# Patient Record
Sex: Male | Born: 1960 | Race: White | Hispanic: No | Marital: Married | Smoking: Former smoker
Health system: Southern US, Community
[De-identification: ages and names within clinical notes are randomized; demographics above are authoritative.]

## PROBLEM LIST (undated history)

## (undated) DIAGNOSIS — E785 Hyperlipidemia, unspecified: Secondary | ICD-10-CM

## (undated) DIAGNOSIS — I209 Angina pectoris, unspecified: Secondary | ICD-10-CM

## (undated) DIAGNOSIS — I219 Acute myocardial infarction, unspecified: Secondary | ICD-10-CM

## (undated) DIAGNOSIS — F419 Anxiety disorder, unspecified: Secondary | ICD-10-CM

## (undated) DIAGNOSIS — I10 Essential (primary) hypertension: Secondary | ICD-10-CM

## (undated) DIAGNOSIS — G629 Polyneuropathy, unspecified: Secondary | ICD-10-CM

## (undated) HISTORY — PX: COLONOSCOPY: SHX174

## (undated) HISTORY — PX: FOOT SURGERY: SHX648

## (undated) HISTORY — DX: Hyperlipidemia, unspecified: E78.5

---

## 2013-12-08 ENCOUNTER — Ambulatory Visit (INDEPENDENT_AMBULATORY_CARE_PROVIDER_SITE_OTHER): Payer: 59

## 2013-12-08 ENCOUNTER — Ambulatory Visit (INDEPENDENT_AMBULATORY_CARE_PROVIDER_SITE_OTHER): Payer: 59 | Admitting: Family Medicine

## 2013-12-08 ENCOUNTER — Emergency Department: Payer: Self-pay | Admitting: Emergency Medicine

## 2013-12-08 VITALS — BP 130/80 | HR 87 | Temp 97.8°F | Resp 16 | Ht 63.75 in | Wt 152.0 lb

## 2013-12-08 DIAGNOSIS — R079 Chest pain, unspecified: Secondary | ICD-10-CM

## 2013-12-08 DIAGNOSIS — Z131 Encounter for screening for diabetes mellitus: Secondary | ICD-10-CM

## 2013-12-08 DIAGNOSIS — Z1329 Encounter for screening for other suspected endocrine disorder: Secondary | ICD-10-CM

## 2013-12-08 DIAGNOSIS — Z13220 Encounter for screening for lipoid disorders: Secondary | ICD-10-CM

## 2013-12-08 DIAGNOSIS — R0602 Shortness of breath: Secondary | ICD-10-CM

## 2013-12-08 LAB — COMPREHENSIVE METABOLIC PANEL
ALBUMIN: 3.9 g/dL (ref 3.4–5.0)
ANION GAP: 7 (ref 7–16)
Alkaline Phosphatase: 107 U/L
BILIRUBIN TOTAL: 0.4 mg/dL (ref 0.2–1.0)
BUN: 12 mg/dL (ref 7–18)
CREATININE: 0.99 mg/dL (ref 0.60–1.30)
Calcium, Total: 8.6 mg/dL (ref 8.5–10.1)
Chloride: 106 mmol/L (ref 98–107)
Co2: 29 mmol/L (ref 21–32)
Glucose: 118 mg/dL — ABNORMAL HIGH (ref 65–99)
OSMOLALITY: 284 (ref 275–301)
POTASSIUM: 3.7 mmol/L (ref 3.5–5.1)
SGOT(AST): 21 U/L (ref 15–37)
SGPT (ALT): 29 U/L
Sodium: 142 mmol/L (ref 136–145)
Total Protein: 7.5 g/dL (ref 6.4–8.2)

## 2013-12-08 LAB — CBC
HCT: 52.1 % — ABNORMAL HIGH (ref 40.0–52.0)
HGB: 17.5 g/dL (ref 13.0–18.0)
MCH: 30.4 pg (ref 26.0–34.0)
MCHC: 33.6 g/dL (ref 32.0–36.0)
MCV: 90 fL (ref 80–100)
PLATELETS: 193 10*3/uL (ref 150–440)
RBC: 5.77 10*6/uL (ref 4.40–5.90)
RDW: 14.1 % (ref 11.5–14.5)
WBC: 9.2 10*3/uL (ref 3.8–10.6)

## 2013-12-08 LAB — POCT CBC
Granulocyte percent: 70.9 %G (ref 37–80)
HCT, POC: 52 % (ref 43.5–53.7)
Hemoglobin: 17.3 g/dL (ref 14.1–18.1)
Lymph, poc: 2.6 (ref 0.6–3.4)
MCH, POC: 29.8 pg (ref 27–31.2)
MCHC: 33.3 g/dL (ref 31.8–35.4)
MCV: 89.3 fL (ref 80–97)
MID (cbc): 0.3 (ref 0–0.9)
MPV: 6.7 fL (ref 0–99.8)
POC Granulocyte: 7 — AB (ref 2–6.9)
POC LYMPH PERCENT: 26.1 %L (ref 10–50)
POC MID %: 3 %M (ref 0–12)
Platelet Count, POC: 212 10*3/uL (ref 142–424)
RBC: 5.82 M/uL (ref 4.69–6.13)
RDW, POC: 13.9 %
WBC: 9.9 10*3/uL (ref 4.6–10.2)

## 2013-12-08 LAB — COMPLETE METABOLIC PANEL WITH GFR
ALT: 19 U/L (ref 0–53)
AST: 16 U/L (ref 0–37)
Albumin: 4.6 g/dL (ref 3.5–5.2)
BUN: 10 mg/dL (ref 6–23)
CO2: 25 mEq/L (ref 19–32)
Calcium: 9.2 mg/dL (ref 8.4–10.5)
Chloride: 104 mEq/L (ref 96–112)
Creat: 0.76 mg/dL (ref 0.50–1.35)
GFR, Est African American: >89 mL/min
GFR, Est Non African American: >89 mL/min
Glucose, Bld: 90 mg/dL (ref 70–99)
Potassium: 4.3 mEq/L (ref 3.5–5.3)
Sodium: 139 mEq/L (ref 135–145)
Total Bilirubin: 0.5 mg/dL (ref 0.2–1.2)
Total Protein: 6.6 g/dL (ref 6.0–8.3)

## 2013-12-08 LAB — COMPLETE METABOLIC PANEL WITHOUT GFR: Alkaline Phosphatase: 87 U/L (ref 39–117)

## 2013-12-08 LAB — LIPID PANEL
Cholesterol: 225 mg/dL — ABNORMAL HIGH (ref 0–200)
HDL: 41 mg/dL (ref >39)
LDL Cholesterol: 170 mg/dL — ABNORMAL HIGH (ref 0–99)
Total CHOL/HDL Ratio: 5.5 ratio
Triglycerides: 70 mg/dL (ref <150)
VLDL: 14 mg/dL (ref 0–40)

## 2013-12-08 LAB — POCT GLYCOSYLATED HEMOGLOBIN (HGB A1C): Hemoglobin A1C: 5.7

## 2013-12-08 LAB — CK TOTAL AND CKMB (NOT AT ARMC)
CK, Total: 48 U/L
CK-MB: 1.6 ng/mL (ref 0.5–3.6)

## 2013-12-08 LAB — TROPONIN I: Troponin-I: <0.02 ng/mL

## 2013-12-08 NOTE — Progress Notes (Signed)
Chief Complaint:  Chief Complaint  Patient presents with  . Anxiety    x1 week     HPI: Vincent GalleryScott [name] is a 53 y.o. male who is here for diffuse midepigastric chest pain,  back pain  and also left sided chest pain for a "while now just with exertion"  He states it has been for at least 1 year but maybe as long as 2,  he can walk from his shop on his property and to his house and would get SOB and have CP. He works currently at [employer] in [employer] , and he used to work in Engineer, agriculturalchemical. He has wheezing back then but not so much since he has been [place]ferred to [employer] Chest pain  Is intermittent, last one was this Am, he usually has it with exertion and not necessarily at rest. Can last for as long as he is exerting himself. He has had NKI,  He was smoking cigs abotu 1 ppd x 4o years up until abotu 2 years ago when he had to push a nonworking care and felt chest pain, he then started picking up cigars . Denies swelling, he woke up this morning felt like he was going to die , primarily he was SOB but not having CP. He denies OSA, he denies COPD He denies any hyperlipidemia, diabetes, no family history of heart disease.  He is a smoker , he smokes cigars 4 times a day and cigs 1 ppd for 40 years Never goes to doctor so has not seen cardiologist, he feels it goes from hi schest to his throat.  He denies getting clammy , having palpitations, diaphoretic, n/v/abd pain He has been doing everthing without exerting himself.  No calf pain, denies PAD, he does have some numbness and tingling i his feet periodically   He has ringing in his ears. 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  He has had decrease hearing.   History reviewed. No pertinent past medical history. Past Surgical History  Procedure Laterality Date  . Foot  surgery     History   Social History  . Marital Status: Married    Spouse Name: N/A    Number of Children: N/A  . Years of Education: N/A   Social History Main Topics  . Smoking status: Current Every Day Smoker  . Smokeless tobacco: None  . Alcohol Use: No  . Drug Use: Yes    Special: Marijuana  . Sexual Activity: None   Other Topics Concern  . None   Social History Narrative  . None   Family History  Problem Relation Age of Onset  . Diabetes Father   . Diabetes Sister   . Cancer Paternal Grandmother   . Mental illness Sister    No Known Allergies Prior to Admission medications   Not on File     ROS: The patient denies fevers, chills, night sweats, unintentional weight loss, palpitations, , nausea, vomiting, abdominal pain, dysuria, hematuria, melena  All other systems have been reviewed and were otherwise negative with the exception of those mentioned in the HPI and as above.    PHYSICAL EXAM: Filed Vitals:   12/08/13 1352  BP: 130/80  Pulse: 87  Temp: 97.8 F (36.6 C)  Resp: 16   Filed Vitals:   12/08/13 1352  Height: 5' 3.75" (1.619 m)  Weight: 152 lb (68.947 kg)   Body mass index is 26.3 kg/(m^2).  General: Alert, no acute distress HEENT:  Normocephalic, atraumatic, oropharynx patent. EOMI, PERRLA, fundo exam nl, tm nl Cardiovascular:  Regular rate and rhythm, no rubs murmurs or gallops.  No Carotid bruits, radial pulse intact. No pedal edema.  Respiratory: Clear to auscultation bilaterally.  No wheezes, rales, or rhonchi.  No cyanosis, no use of accessory musculature GI: No organomegaly, abdomen is soft and non-tender, positive bowel sounds.  No masses. Skin: No rashes. Neurologic: Facial musculature symmetric. Psychiatric: Patient is appropriate throughout our interaction. CN 2-12 grossly intact Lymphatic: No cervical lymphadenopathy Musculoskeletal: Gait intact. 5/5 strength IN UE and Kai Calico   LABS: Results for orders placed or performed in  visit on 12/08/13  POCT CBC  Result Value Ref Range   WBC 9.9 4.6 - 10.2 K/uL   Lymph, poc 2.6 0.6 - 3.4   POC LYMPH PERCENT 26.1 10 - 50 %L   MID (cbc) 0.3 0 - 0.9   POC MID % 3.0 0 - 12 %M   POC Granulocyte 7.0 (A) 2 - 6.9   Granulocyte percent 70.9 37 - 80 %G   RBC 5.82 4.69 - 6.13 M/uL   Hemoglobin 17.3 14.1 - 18.1 g/dL   HCT, POC 40.9 81.1 - 53.7 %   MCV 89.3 80 - 97 fL   MCH, POC 29.8 27 - 31.2 pg   MCHC 33.3 31.8 - 35.4 g/dL   RDW, POC 91.4 %   Platelet Count, POC 212 142 - 424 K/uL   MPV 6.7 0 - 99.8 fL  POCT glycosylated hemoglobin (Hb A1C)  Result Value Ref Range   Hemoglobin A1C 5.7      EKG/XRAY:   Primary read interpreted by Dr. Conley Rolls at [facility]. Please comment if there is any worrisome left perihilar density , otherwise no acute cardiopulmonary process EKG shows abnl changes without ST elevation/depression   ASSESSMENT/PLAN: Encounter Diagnoses  Name Primary?  . Chest pain, unspecified chest pain type Yes  . SOB (shortness of breath)   . Screening for hyperlipidemia   . Screening for diabetes mellitus   . Screening for hypothyroidism    53 y/o male with 1-2 year history of exertional CP , last episode this AM, he has had it everytime he does any type of exertion until he rests. VSS He  Is a 40 year year smoker , has transition to cigars from cigarettes, No dx of HTN, DM, hyperlipidemia, family hx of heart disease.  Exertional CP with abnormal  EKG changes, no prior one to compare with. Likely old changes  Patient declined to go to ER to  by ambulance, he does not take ASA He has risk factors: tobacco use  Will send to [facility] ER at patient's request for further eval, risk and benefits explained with transport by privatre  Go to ER as directed, called  [facility] spoke with charge nurse and notified pt went by private vehicle I advise patient that if he has any anxiety issues then we can readdress this at a later date but his CP/SOB of utmost concern.  F/u  prn   Gross sideeffects, risk and benefits, and alternatives of medications d/w patient. Patient is aware that all medications have potential sideeffects and we are unable to predict every sideeffect or drug-drug interaction that may occur.  Aleza Pew PHUONG, DO 12/08/2013  3:06 PM

## 2013-12-08 NOTE — Patient Instructions (Signed)

## 2013-12-09 LAB — TSH: TSH: 1.664 u[IU]/mL (ref 0.350–4.500)

## 2013-12-11 ENCOUNTER — Telehealth: Payer: Self-pay | Admitting: Family Medicine

## 2013-12-11 NOTE — Telephone Encounter (Signed)
LM about labs, needs to call me back, I would like to know if he went to the ER for further evaluation or not. If not then need to refer him to cardiology. Since he is a smoker and having CP then would recommend being on a statin but that has SEs as well. Advise to call me back when I am working tomorrow in the AM. # to office given.

## 2013-12-12 ENCOUNTER — Telehealth: Payer: Self-pay

## 2013-12-12 NOTE — Telephone Encounter (Signed)
Patients wife Marily Memosdna called stated her husband was seen by [facility]aukat [facility] on 12/09/13. Per spouse their office is requesting patients cholesterol results from our office. Requesting them to be faxed to [phone] and their office number is [phone]. Edna's call back number is [phone]

## 2013-12-15 NOTE — Telephone Encounter (Signed)
Records faxed thru Epic. °

## 2014-01-02 ENCOUNTER — Ambulatory Visit: Payer: Self-pay | Admitting: Cardiovascular Disease

## 2014-01-05 ENCOUNTER — Ambulatory Visit (HOSPITAL_COMMUNITY)
Admission: RE | Admit: 2014-01-05 | Discharge: 2014-01-05 | Disposition: A | Discharge status: Home / Self Care | Payer: 59 | Source: Ambulatory Visit | Attending: Cardiothoracic Surgery | Admitting: Cardiothoracic Surgery

## 2014-01-05 ENCOUNTER — Institutional Professional Consult (permissible substitution) (INDEPENDENT_AMBULATORY_CARE_PROVIDER_SITE_OTHER): Payer: 59 | Admitting: Cardiothoracic Surgery

## 2014-01-05 ENCOUNTER — Encounter (HOSPITAL_COMMUNITY): Payer: Self-pay

## 2014-01-05 ENCOUNTER — Other Ambulatory Visit: Payer: Self-pay | Admitting: *Deleted

## 2014-01-05 ENCOUNTER — Encounter: Payer: Self-pay | Admitting: Cardiothoracic Surgery

## 2014-01-05 ENCOUNTER — Encounter (HOSPITAL_COMMUNITY)
Admission: RE | Admit: 2014-01-05 | Discharge: 2014-01-05 | Disposition: A | Discharge status: Home / Self Care | Payer: 59 | Source: Ambulatory Visit | Attending: Cardiothoracic Surgery | Admitting: Cardiothoracic Surgery

## 2014-01-05 VITALS — BP 155/89 | HR 74 | Resp 20 | Ht 64.0 in | Wt 156.0 lb

## 2014-01-05 VITALS — BP 139/79 | HR 72 | Temp 98.0°F | Resp 20 | Ht 64.0 in | Wt 155.2 lb

## 2014-01-05 DIAGNOSIS — E7800 Pure hypercholesterolemia, unspecified: Secondary | ICD-10-CM | POA: Insufficient documentation

## 2014-01-05 DIAGNOSIS — I10 Essential (primary) hypertension: Secondary | ICD-10-CM | POA: Diagnosis present

## 2014-01-05 DIAGNOSIS — I2510 Atherosclerotic heart disease of native coronary artery without angina pectoris: Secondary | ICD-10-CM

## 2014-01-05 DIAGNOSIS — D696 Thrombocytopenia, unspecified: Secondary | ICD-10-CM | POA: Diagnosis present

## 2014-01-05 DIAGNOSIS — I2582 Chronic total occlusion of coronary artery: Secondary | ICD-10-CM | POA: Diagnosis present

## 2014-01-05 DIAGNOSIS — Z01818 Encounter for other preprocedural examination: Secondary | ICD-10-CM | POA: Insufficient documentation

## 2014-01-05 DIAGNOSIS — R931 Abnormal findings on diagnostic imaging of heart and coronary circulation: Secondary | ICD-10-CM | POA: Insufficient documentation

## 2014-01-05 DIAGNOSIS — I25110 Atherosclerotic heart disease of native coronary artery with unstable angina pectoris: Secondary | ICD-10-CM

## 2014-01-05 DIAGNOSIS — I340 Nonrheumatic mitral (valve) insufficiency: Secondary | ICD-10-CM | POA: Diagnosis present

## 2014-01-05 DIAGNOSIS — F17210 Nicotine dependence, cigarettes, uncomplicated: Secondary | ICD-10-CM | POA: Diagnosis present

## 2014-01-05 DIAGNOSIS — I071 Rheumatic tricuspid insufficiency: Secondary | ICD-10-CM | POA: Diagnosis present

## 2014-01-05 DIAGNOSIS — D62 Acute posthemorrhagic anemia: Secondary | ICD-10-CM | POA: Diagnosis not present

## 2014-01-05 DIAGNOSIS — Z7901 Long term (current) use of anticoagulants: Secondary | ICD-10-CM

## 2014-01-05 DIAGNOSIS — R9430 Abnormal result of cardiovascular function study, unspecified: Secondary | ICD-10-CM

## 2014-01-05 DIAGNOSIS — I252 Old myocardial infarction: Secondary | ICD-10-CM

## 2014-01-05 DIAGNOSIS — F419 Anxiety disorder, unspecified: Secondary | ICD-10-CM | POA: Diagnosis present

## 2014-01-05 DIAGNOSIS — G629 Polyneuropathy, unspecified: Secondary | ICD-10-CM | POA: Diagnosis present

## 2014-01-05 HISTORY — DX: Anxiety disorder, unspecified: F41.9

## 2014-01-05 HISTORY — DX: Acute myocardial infarction, unspecified: I21.9

## 2014-01-05 HISTORY — DX: Polyneuropathy, unspecified: G62.9

## 2014-01-05 HISTORY — DX: Essential (primary) hypertension: I10

## 2014-01-05 HISTORY — DX: Angina pectoris, unspecified: I20.9

## 2014-01-05 LAB — URINALYSIS, ROUTINE W REFLEX MICROSCOPIC
Bilirubin Urine: NEGATIVE
Glucose, UA: NEGATIVE mg/dL
Ketones, ur: 15 mg/dL — AB
Leukocytes, UA: NEGATIVE
Nitrite: NEGATIVE
Protein, ur: NEGATIVE mg/dL
Specific Gravity, Urine: 1.03 (ref 1.005–1.030)
Urobilinogen, UA: 1 mg/dL (ref 0.0–1.0)
pH: 5.5 (ref 5.0–8.0)

## 2014-01-05 LAB — COMPREHENSIVE METABOLIC PANEL
ALT: 14 U/L (ref 0–53)
AST: 13 U/L (ref 0–37)
Albumin: 3.7 g/dL (ref 3.5–5.2)
Alkaline Phosphatase: 98 U/L (ref 39–117)
Anion gap: 15 (ref 5–15)
BUN: 14 mg/dL (ref 6–23)
CO2: 22 mEq/L (ref 19–32)
Calcium: 9.4 mg/dL (ref 8.4–10.5)
Chloride: 103 mEq/L (ref 96–112)
Creatinine, Ser: 0.64 mg/dL (ref 0.50–1.35)
GFR calc Af Amer: >90 mL/min (ref >90)
GFR calc non Af Amer: >90 mL/min (ref >90)
Glucose, Bld: 137 mg/dL — ABNORMAL HIGH (ref 70–99)
Potassium: 3.9 mEq/L (ref 3.7–5.3)
Sodium: 140 mEq/L (ref 137–147)
Total Bilirubin: 0.4 mg/dL (ref 0.3–1.2)
Total Protein: 7 g/dL (ref 6.0–8.3)

## 2014-01-05 LAB — BLOOD GAS, ARTERIAL
Acid-Base Excess: 1.3 mmol/L (ref 0.0–2.0)
Bicarbonate: 25.1 mEq/L — ABNORMAL HIGH (ref 20.0–24.0)
Drawn by: 206361
FIO2: 0.21 %
O2 Saturation: 97 %
Patient temperature: 98.6
TCO2: 26.3 mmol/L (ref 0–100)
pCO2 arterial: 38.2 mmHg (ref 35.0–45.0)
pH, Arterial: 7.434 (ref 7.350–7.450)
pO2, Arterial: 83.5 mmHg (ref 80.0–100.0)

## 2014-01-05 LAB — CBC
HCT: 45 % (ref 39.0–52.0)
Hemoglobin: 15.7 g/dL (ref 13.0–17.0)
MCH: 30 pg (ref 26.0–34.0)
MCHC: 34.9 g/dL (ref 30.0–36.0)
MCV: 85.9 fL (ref 78.0–100.0)
Platelets: 177 10*3/uL (ref 150–400)
RBC: 5.24 MIL/uL (ref 4.22–5.81)
RDW: 13.2 % (ref 11.5–15.5)
WBC: 7.9 10*3/uL (ref 4.0–10.5)

## 2014-01-05 LAB — TYPE AND SCREEN
ABO/RH(D): A POS
Antibody Screen: NEGATIVE

## 2014-01-05 LAB — URINE MICROSCOPIC-ADD ON

## 2014-01-05 LAB — HEMOGLOBIN A1C
Hgb A1c MFr Bld: 6 % — ABNORMAL HIGH (ref <5.7)
Mean Plasma Glucose: 126 mg/dL — ABNORMAL HIGH (ref <117)

## 2014-01-05 LAB — SURGICAL PCR SCREEN
MRSA, PCR: NEGATIVE
Staphylococcus aureus: NEGATIVE

## 2014-01-05 LAB — ABO/RH: ABO/RH(D): A POS

## 2014-01-05 LAB — PROTIME-INR
INR: 1.08 (ref 0.00–1.49)
Prothrombin Time: 14.1 seconds (ref 11.6–15.2)

## 2014-01-05 LAB — APTT: aPTT: 30 seconds (ref 24–37)

## 2014-01-05 MED ORDER — ALPRAZOLAM 0.25 MG PO TABS: 0.2500 mg | ORAL_TABLET | Freq: Two times a day (BID) | ORAL | Status: DC | PRN

## 2014-01-05 NOTE — Progress Notes (Signed)
Primary - no one Cardiologist - dr. Welton FlakesKhan ([facility]) Cath, stress, echo, ekg - will request

## 2014-01-05 NOTE — Patient Instructions (Signed)
Coronary Artery Bypass Grafting Coronary artery bypass grafting (CABG) is a procedure done to bypass or fix arteries of the heart (coronary arteries) that have become narrow or blocked. This narrowing is usually the result of plaque that has built up in the walls of the vessels. The coronary arteries supply the heart with the oxygen and nutrients it needs to pump blood to your body. In the CABG procedure, a section of blood vessel from another part of the body (usually the leg, arm, or chest wall) is removed and then inserted where it will allow blood to bypass the damaged part of the coronary artery.  LET [facility] KNOW ABOUT:  Any allergies you have.   All medicines you are taking, including blood thinners, vitamins, herbs, eye drops, creams, and over-the-counter medicines.   Use of steroids (by mouth or creams).   Previous problems you or members of your family have had with the use of anesthetics.   Any blood disorders you have.   Previous surgeries you have had.   Medical conditions you have.  RISKS AND COMPLICATIONS Generally, this is a safe procedure. However, problems can occur and include:   Blood loss.   Stroke.   Infection.   Pain at the surgical site.      Heart attack during or after surgery.   Kidney failure.  BEFORE THE PROCEDURE  Take medicines only as directed by your health care provider. You may be asked to start new medicines and stop taking others. Do not stop medicines or adjust dosages on your own.  Do not eat or drink anything after midnight the night before the procedure or as directed by your health care provider. Ask your health care provider if it is okay to take a sip of water with any needed medicines. PROCEDURE The surgeon may use either an open technique or a minimally invasive technique for this surgery. Traditional open surgery  You will be given medicine to make you sleep through the procedure (general  anesthetic).  Once you are asleep, a cut (incision) will be made down the front of the chest through the breastbone (sternum). The sternum will be spread open so your heart can be seen.  You will then be placed on a heart-lung bypass machine. This machine will provide oxygen to your blood while the heart is undergoing surgery.  Your heart will then be temporarily stopped so that the surgeon can do the next steps. 1. A section of vein will likely be taken from your leg and used to bypass the blocked arteries of your heart. Sometimes parts of an artery from inside your chest wall or from your arm will be used, either alone or in combination with leg veins. 2. When the bypasses are done, you will be taken off the machine. 3. Your heart will be restarted and will take over again normally. 4. The sac around the heart will be closed.  Your chest will then be closed with stitches or staples.  Tubes will remain in your chest and will be connected to a suction device in order to help drain fluid and reinflate the lungs. Minimally invasive surgery This technique is done from an incision over your left chest area. If appropriate, your surgeon may not have to slow or stop your heart. If your condition allows for this procedure, there will often be less blood loss, less pain, a shorter hospital stay, and faster recovery compared to traditional open surgery. AFTER THE PROCEDURE  You will  be taken to a recovery area to be monitored.  You may wake up with a tube in your throat to help your breathing. You may be connected to a breathing machine. You will not be able to talk while the tube is in place. The tube will be taken out as soon as it is safe to do so.  You will be groggy and may have some pain. You will be given pain medicine to help control the pain. Document Released: 10/26/2004 Document Revised: 06/02/2013 Document Reviewed: 06/25/2012 [facility] Patient Information 2015 [place], [place]. This  information is not intended to replace advice given to you by your health care provider. Make sure you discuss any questions you have with your health care provider.       [address]       [name] [address]             [phone]       Coronary Artery Bypass Grafting  Care After  Refer to this sheet in the next few weeks. These instructions provide you with information on caring for yourself after your procedure. Your caregiver may also give you more specific instructions. Your treatment has been planned according to current medical practices, but problems sometimes occur. Call your caregiver if you have any problems or questions after your procedure.  Recovery from open heart surgery will be different for everyone. Some people feel well after 3 or 4 weeks, while for others it takes longer. After heart surgery, it may be normal to:  Not have an appetite, feel nauseated by the smell of food, or only want to eat a small amount.   Be constipated because of changes in your diet, activity, and medicines. Eat foods high in fiber. Add fresh fruits and vegetables to your diet. Stool softeners may be helpful.   Feel sad or unhappy. You may be frustrated or cranky. You may have good days and bad days. Do not give up. Talk to your caregiver if you do not feel better.   Feel weakness and fatigue. You many need physical therapy or cardiac rehabilitation to get your strength back.   Develop an irregular heartbeat called atrial fibrillation. Symptoms of atrial fibrillation are a fast, irregular heartbeat or feelings of fluttery heartbeats, shortness of breath, low blood pressure, and dizziness. If these symptoms develop, see your caregiver right away.  MEDICATION  Have a list of all the medicines you will be taking when you leave the hospital. For every medicine, know the following:   Name.   Exact dose.   Time of day to be taken.   How often it should be taken.   Why  you are taking it.   Ask which medicines should or should not be taken together. If you take more than one heart medicine, ask if it is okay to take them together. Some heart medicines should not be taken at the same time because they may lower your blood pressure too much.   Narcotic pain medicine can cause constipation. Eat fresh fruits and vegetables. Add fiber to your diet. Stool softener medicine may help relieve constipation.   Keep a copy of your medicines with you at all times.   Do not add or stop taking any medicine until you check with your caregiver.   Medicines can have side effects. Call your caregiver who prescribed the medicine if you:   Start throwing up, have diarrhea, or have stomach pain.   Feel dizzy or lightheaded  when you stand up.   Feel your heart is skipping beats or is beating too fast or too slow.   Develop a rash.   Notice unusual bruising or bleeding.  HOME CARE INSTRUCTIONS  After heart surgery, it is important to learn how to take your pulse. Have your caregiver show you how to take your pulse.   Use your incentive spirometer. Ask your caregiver how long after surgery you need to use it.  Care of your chest incision  Tell your caregiver right away if you notice clicking in your chest (sternum).   Support your chest with a pillow or your arms when you take deep breaths and cough.   Follow your caregiver's instructions about when you can bathe or swim.   Protect your incision from sunlight during the first year to keep the scar from getting dark.   Tell your caregiver if you notice:   Increased tenderness of your incision.   Increased redness or swelling around your incision.   Drainage or pus from your incision.  Care of your leg incision(s)  Avoid crossing your legs.   Avoid sitting for long periods of time. Change positions every half hour.   Elevate your leg(s) when you are sitting.   Check your leg(s) daily for swelling. Check the  incisions for redness or drainage.   Diet is very important to heart health.   Eat plenty of fresh fruits and vegetables. Meats should be lean cut. Avoid canned, processed, and fried foods.   Talk to a dietician. They can teach you how to make healthy food and drink choices.  Weight  Weigh yourself every day. This is important because it helps to know if you are retaining fluid that may make your heart and lungs work harder.   Use the same scale each time.   Weigh yourself every morning at the same time. You should do this after you go to the bathroom, but before you eat breakfast.   Your weight will be more accurate if you do not wear any clothes.   Record your weight.   Tell your caregiver if you have gained 2 pounds or more overnight.  Activity Stop any activity at once if you have chest pain, shortness of breath, irregular heartbeats, or dizziness. Get help right away if you have any of these symptoms.  Bathing.  Avoid soaking in a bath or hot tub until your incisions are healed.   Rest. You need a balance of rest and activity.   Exercise. Exercise per your caregiver's advice. You may need physical therapy or cardiac rehabilitation to help strengthen your muscles and build your endurance.   Climbing stairs. Unless your caregiver tells you not to climb stairs, go up stairs slowly and rest if you tire. Do not pull yourself up by the handrail.   Driving a car. Follow your caregiver's advice on when you may drive. You may ride as a passenger at any time. When traveling for long periods of time in a car, get out of the car and walk around for a few minutes every 2 hours.   Lifting. Avoid lifting, pushing, or pulling anything heavier than 10 pounds for 6 weeks after surgery or as told by your caregiver.   Returning to work. Check with your caregiver. People heal at different rates. Most people will be able to go back to work 6 to 12 weeks after surgery.   Sexual activity. You may  resume sexual relations as told by your  caregiver.  SEEK MEDICAL CARE IF:  Any of your incisions are red, painful, or have any type of drainage coming from them.   You have an oral temperature above 101.5 F .   You have ankle or leg swelling.   You have pain in your legs.   You have weight gain of 2 or more pounds a day.   You feel dizzy or lightheaded when you stand up.  SEEK IMMEDIATE MEDICAL CARE IF:  You have angina or chest pain that goes to your jaw or arms. Call your local emergency services right away.   You have shortness of breath at rest or with activity.   You have a fast or irregular heartbeat (arrhythmia).   There is a "clicking" in your sternum when you move.   You have numbness or weakness in your arms or legs.  MAKE SURE YOU:  Understand these instructions.   Will watch your condition.   Will get help right away if you are not doing well or get worse.    No lifting over 25 lbs for 3 months

## 2014-01-05 NOTE — Pre-Procedure Instructions (Signed)
[name]  01/05/2014   Your procedure is scheduled on:  Wednesday, December 9th  [facility].  Call this number if you have problems the morning of surgery: [phone]   Remember:   Do not eat food or drink liquids after midnight.   Take these medicines the morning of surgery with A SIP OF WATER: coreg, xanax if needed, tylenol if needed   Do not wear jewelry.  Do not wear lotions, powders, or perfumes,deodorant.  Do not shave 48 hours prior to surgery. Men may shave face and neck.  Do not bring valuables to the hospital.  [facility] is not responsible  for any belongings or valuables.               Contacts, dentures or bridgework may not be worn into surgery.  Leave suitcase in the car. After surgery it may be brought to your room.  For patients admitted to the hospital, discharge time is determined by your treatment team.           Please read over the following fact sheets that you were given: Pain Booklet, Coughing and Deep Breathing, Blood Transfusion Information, MRSA Information and Surgical Site Infection Prevention  [facility] - Preparing for Surgery  Before surgery, you can play an important role.  Because skin is not sterile, your skin needs to be as free of germs as possible.  You can reduce the number of germs on you skin by washing with CHG (chlorahexidine gluconate) soap before surgery.  CHG is an antiseptic cleaner which kills germs and bonds with the skin to continue killing germs even after washing.  Please DO NOT use if you have an allergy to CHG or antibacterial soaps.  If your skin becomes reddened/irritated stop using the CHG and inform your nurse when you arrive at Short Stay.  Do not shave (including legs and underarms) for at least 48 hours prior to the first CHG shower.  You may shave your face.  Please follow these instructions carefully:   1.  Shower with CHG Soap the night before surgery and the morning of  Surgery.  2.  If you choose to wash your hair, wash your hair first as usual with your normal shampoo.  3.  After you shampoo, rinse your hair and body thoroughly to remove the shampoo.  4.  Use CHG as you would any other liquid soap.  You can apply CHG directly to the skin and wash gently with scrungie or a clean washcloth.  5.  Apply the CHG Soap to your body ONLY FROM THE NECK DOWN.  Do not use on open wounds or open sores.  Avoid contact with your eyes, ears, mouth and genitals (private parts).  Wash genitals (private parts) with your normal soap.  6.  Wash thoroughly, paying special attention to the area where your surgery will be performed.  7.  Thoroughly rinse your body with warm water from the neck down.  8.  DO NOT shower/wash with your normal soap after using and rinsing off the CHG Soap.  9.  Pat yourself dry with a clean towel.            10.  Wear clean pajamas.            11.  Place clean sheets on your bed the night of your first shower and do not sleep with pets.  Day of Surgery  Do not apply any lotions/deoderants  the morning of surgery.  Please wear clean clothes to the hospital/surgery center.

## 2014-01-05 NOTE — Progress Notes (Signed)
[address]       [place] [address]             [phone]                    Korvin [facility] Medical Record #[id number] Date of Birth: [birth year]  Referring: Laurier Nancy, MD Primary Care: No PCP Per Patient  Chief Complaint:    Chief Complaint  Patient presents with  . Coronary Artery Disease    Surgical eval for possible CABG, cadiac cath 01/02/14    History of Present Illness:    Duvall Comes 53 y.o. male is seen in the office for consideration of coronary artery bypass graft. The patient has approximately 2 year history of on and off episodes of shortness of breath and chest discomfort radiating to the neck brought on with activity. Recently he had a more severe episode associated with more anxiety at the urging of his family went to urgent care in [place] was then referred to the [facility] emergency room and then referred as an outpatient to Dr. Park Breed. An outpatient stress test echocardiogram and cardiac CT scan were performed. Patient was noted to have moderately depressed LV systolic function with severe hypokinetic to akinetic apex mild tricuspid regurgitation and mild mitral regurgitation. Patient had ejection fraction of 40% with diffuse hypokinesis large apical septal and inferior fixed defect on myocardial perfusion study.  The patient has definite exertionally-related anginal symptoms, present for at least 2 years but progressively becoming more severe. 2-3 years ago he remembers a specific episode while loading a car onto a truck where he had very severe prolonged chest discomfort but did not seek medical attention at that time.   The patient denies diabetes he had been a long-term smoker for more than 30 years currently smokes occasional cigar.   Current Activity/ Functional Status:  Patient is independent with mobility/ambulation, transfers, ADL's, IADL's.   Zubrod Score: At the time of surgery this patient's most appropriate  activity status/level should be described as: []     0    Normal activity, no symptoms [x]     1    Restricted in physical strenuous activity but ambulatory, able to do out light work []     2    Ambulatory and capable of self care, unable to do work activities, up and about               >50 % of waking hours                              []     3    Only limited self care, in bed greater than 50% of waking hours []     4    Completely disabled, no self care, confined to bed or chair []     5    Moribund   No past medical history on file.  Past Surgical History  Procedure Laterality Date  . Foot surgery      right    Family History  Problem Relation Age of Onset  . Diabetes Father   . Diabetes Sister   . Cancer Paternal Grandmother   . Mental illness Sister    patient's father died at age 2 with cirrhosis of the liver is mother is age 28 and healthy  History   Social History  . Marital Status: Married    Spouse Name:  N/A    Number of Children: 2  . Years of Education: N/A   Occupational History  . 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 Smoking status: Current Every Day Smoker  . Smokeless tobacco: Not on file  . Alcohol Use: No  . Drug Use: Yes    Special: Marijuana  . Sexual Activity: Not on file   Other Topics Concern  . Not on file   Social History Narrative    History  Smoking status  . Current Every Day Smoker  Smokeless tobacco  . Not on file    History  Alcohol Use No     No Known Allergies  Current Outpatient Prescriptions  Medication Sig Dispense Refill  . aspirin 81 MG tablet Take 81 mg by mouth daily.    . carvedilol (COREG) 6.25 MG tablet Take 6.25 mg by mouth 2 (two) times daily with a meal.   0  . acetaminophen (TYLENOL) 500 MG tablet Take 1,000 mg by mouth every 6 (six) hours as needed.    . ALPRAZolam (XANAX) 0.25 MG tablet Take 1 tablet (0.25 mg total) by mouth 2 (two) times daily as needed for  anxiety. 10 tablet 0   No current facility-administered medications for this visit.     Review of Systems:     Cardiac Review of Systems: Y or N  Chest Pain [  y  ]  Resting SOB [ n  ] Exertional SOB  [ y ]  Pollyann Kennedyrthopnea Milo.Brash[n  ]   Pedal Edema [n   ]    Palpitations Milo.Brash[n  ] Syncope  [ n ]   Presyncope [n   ]  General Review of Systems: [Y] = yes [  ]=no Constitional: recent weight change [n  ];  Wt loss over the last 3 months [ n  ] anorexia [  ]; fatigue Cove.Etienne[y  ]; nausea [  ]; night sweats [ n ]; fever [  ]; or chills [  ];          Dental: poor dentition[y  ]; Last Dentist visit:   Eye : blurred vision [  ]; diplopia [   ]; vision changes [  ];  Amaurosis fugax[  ]; Resp: cough [  ];  wheezing[ n ];  hemoptysis[n  ]; shortness of breath[  ]; paroxysmal nocturnal dyspnea[  ]; dyspnea on exertion[  ]; or orthopnea[  ];  GI:  gallstones[  ], vomiting[  ];  dysphagia[  ]; melena[  ];  hematochezia [  ]; heartburn[  ];   Hx of  Colonoscopy[  ]; GU: kidney stones [  ]; hematuria[  ];   dysuria [  ];  nocturia[  ];  history of     obstruction [  ]; urinary frequency [  ]             Skin: rash, swelling[  ];, hair loss[  ];  peripheral edema[  ];  or itching[  ]; Musculosketetal: myalgias[  ];  joint swelling[  ];  joint erythema[  ];  joint pain[  ];  back pain[  ];  Heme/Lymph: bruising[  ];  bleeding[  ];  anemia[  ];  Neuro: TIA[  ];  headaches[  ];  stroke[  ];  vertigo[  ];  seizures[  ];   paresthesias[  ];  difficulty walking[  ];  Psych:depression[y  ]; Kendell Baneanxiety[y  ];  Endocrine: diabetes[  ];  thyroid dysfunction[  ];  Immunizations: Flu up to date [  ]; Pneumococcal up to date [  ];  Other:  Physical Exam: BP 155/89 mmHg  Pulse 74  Resp 20  Ht 5\' 4"  (1.626 m)  Wt 156 lb (70.761 kg)  BMI 26.76 kg/m2  SpO2 98%  PHYSICAL EXAMINATION:  General appearance: alert, cooperative, appears stated age and no distress Neurologic: intact Heart: regular rate and rhythm, S1, S2 normal, no murmur,  click, rub or gallop Lungs: clear to auscultation bilaterally Abdomen: soft, non-tender; bowel sounds normal; no masses,  no organomegaly Extremities: extremities normal, atraumatic, no cyanosis or edema, Homans sign is negative, no sign of DVT and Scarring on the right foot from previous injury as a child, [place] in both lower extremity appear adequate for bypass Wound: Patient's right groin has slight amount of bruising related to cardiac cath but without palpable mass Patient has no carotid bruits, PT and DP pulses are present bilaterally   Diagnostic Studies & Laboratory data:     Recent Radiology Findings:   Dg Chest 2 View  12/08/2013   CLINICAL DATA:  Left-sided chest pain.  Shortness of breath.  EXAM: CHEST  2 VIEW  COMPARISON:  None.  FINDINGS: The heart size and mediastinal contours are within normal limits. Both lungs are clear. The visualized skeletal structures are unremarkable.  IMPRESSION: No active cardiopulmonary disease.   Electronically Signed   By: Myles RosenthalJohn  Stahl M.D.   On: 12/08/2013 16:36   Cardiac Cath: Done at [place] shows total occlusion of LAD with collateral filling from the right patient is right dominant circulation 60-70% proximal right stenosis with collateral filling to the LAD ejection fraction is 40% with apical akinesis. The circumflex coronary artery is with luminal irregularities but no high-grade stenosis  Recent Lab Findings: Lab Results  Component Value Date   WBC 9.9 12/08/2013   HGB 17.3 12/08/2013   HCT 52.0 12/08/2013   GLUCOSE 90 12/08/2013   CHOL 225* 12/08/2013   TRIG 70 12/08/2013   HDL 41 12/08/2013   LDLCALC 170* 12/08/2013   ALT 19 12/08/2013   AST 16 12/08/2013   NA 139 12/08/2013   K 4.3 12/08/2013   CL 104 12/08/2013   CREATININE 0.76 12/08/2013   BUN 10 12/08/2013   CO2 25 12/08/2013   TSH 1.664 12/08/2013   HGBA1C 5.7 12/08/2013      Assessment / Plan:   #1 two-vessel coronary artery disease with progressive angina and  totally occluded LAD, depressed left ventricular ejection fraction proximal 40% #2 hypercholesterolemia #3 hypertension #4 long-term tobacco use, no history of diabetes    I've reviewed the patient's preoperative studies, including cardiac catheterization done 2 days ago. With the patient's progressive symptoms and progressive disease in the right with a totally occluded LAD I recommended to the patient that we proceed with revascularization of the left anterior descending coronary distribution and the right coronary. Risks and options of coronary artery bypass grafting were discussed with the patient and his wife in detail. Timely plan to proceed Wednesday, December 9 with CABG    I spent 60 minutes counseling the patient face to face. The total time spent in the appointment was 80 minutes.  Delight OvensEdward B Laetitia Schnepf MD      [address] [place] [id number] Office [phone]   Beeper [phone]  01/05/2014 1:06 PM

## 2014-01-06 ENCOUNTER — Ambulatory Visit (HOSPITAL_COMMUNITY)
Admission: RE | Admit: 2014-01-06 | Discharge: 2014-01-06 | Disposition: A | Discharge status: Home / Self Care | Payer: 59 | Source: Ambulatory Visit | Attending: Cardiothoracic Surgery | Admitting: Cardiothoracic Surgery

## 2014-01-06 ENCOUNTER — Encounter: Payer: Self-pay | Admitting: Cardiothoracic Surgery

## 2014-01-06 DIAGNOSIS — I2510 Atherosclerotic heart disease of native coronary artery without angina pectoris: Secondary | ICD-10-CM

## 2014-01-06 DIAGNOSIS — Z01810 Encounter for preprocedural cardiovascular examination: Secondary | ICD-10-CM

## 2014-01-06 LAB — PULMONARY FUNCTION TEST
DL/VA % pred: 88 %
DL/VA: 3.73 ml/min/mmHg/L
DLCO cor % pred: 62 %
DLCO cor: 15.12 ml/min/mmHg
DLCO unc % pred: 62 %
DLCO unc: 15.12 ml/min/mmHg
FEF 25-75 Post: 1.76 L/sec
FEF 25-75 Pre: 2.21 L/sec
FEF2575-%Change-Post: -19 %
FEF2575-%Pred-Post: 65 %
FEF2575-%Pred-Pre: 81 %
FEV1-%Change-Post: -4 %
FEV1-%Pred-Post: 73 %
FEV1-%Pred-Pre: 76 %
FEV1-Post: 2.22 L
FEV1-Pre: 2.33 L
FEV1FVC-%Change-Post: 3 %
FEV1FVC-%Pred-Pre: 99 %
FEV6-%Change-Post: -7 %
FEV6-%Pred-Post: 74 %
FEV6-%Pred-Pre: 80 %
FEV6-Post: 2.81 L
FEV6-Pre: 3.03 L
FEV6FVC-%Change-Post: 0 %
FEV6FVC-%Pred-Post: 104 %
FEV6FVC-%Pred-Pre: 103 %
FVC-%Change-Post: -7 %
FVC-%Pred-Post: 71 %
FVC-%Pred-Pre: 76 %
FVC-Post: 2.81 L
FVC-Pre: 3.04 L
Post FEV1/FVC ratio: 79 %
Post FEV6/FVC ratio: 100 %
Pre FEV1/FVC ratio: 77 %
Pre FEV6/FVC Ratio: 100 %
RV % pred: 55 %
RV: 1.01 L
TLC % pred: 73 %
TLC: 4.26 L

## 2014-01-06 MED ORDER — PLASMA-LYTE 148 IV SOLN
INTRAVENOUS | Status: AC
  Administered 2014-01-07: 500 mL
  Filled 2014-01-06: qty 2.5

## 2014-01-06 MED ORDER — SODIUM CHLORIDE 0.9 % IV SOLN
INTRAVENOUS | Status: AC
  Administered 2014-01-07: 69 mL/h via INTRAVENOUS
  Filled 2014-01-06: qty 40

## 2014-01-06 MED ORDER — SODIUM CHLORIDE 0.9 % IV SOLN
INTRAVENOUS | Status: AC
  Administered 2014-01-07: 1.3 [IU]/h via INTRAVENOUS
  Filled 2014-01-06: qty 2.5

## 2014-01-06 MED ORDER — DEXMEDETOMIDINE HCL IN NACL 400 MCG/100ML IV SOLN
0.1000 ug/kg/h | INTRAVENOUS | Status: AC
  Administered 2014-01-07: 0.2 ug/kg/h via INTRAVENOUS
  Filled 2014-01-06: qty 100

## 2014-01-06 MED ORDER — DEXTROSE 5 % IV SOLN
750.0000 mg | INTRAVENOUS | Status: DC
  Filled 2014-01-06: qty 750

## 2014-01-06 MED ORDER — CHLORHEXIDINE GLUCONATE 4 % EX LIQD
30.0000 mL | CUTANEOUS | Status: DC
Start: 2014-01-06 — End: 2014-01-07
  Filled 2014-01-06: qty 30

## 2014-01-06 MED ORDER — DEXTROSE 5 % IV SOLN
1.5000 g | INTRAVENOUS | Status: AC
  Administered 2014-01-07: 1.5 g via INTRAVENOUS
  Administered 2014-01-07: .75 g via INTRAVENOUS
  Filled 2014-01-06 (×2): qty 1.5

## 2014-01-06 MED ORDER — SODIUM CHLORIDE 0.9 % IV SOLN
INTRAVENOUS | Status: DC
  Filled 2014-01-06: qty 30

## 2014-01-06 MED ORDER — DOPAMINE-DEXTROSE 3.2-5 MG/ML-% IV SOLN
0.0000 ug/kg/min | INTRAVENOUS | Status: AC
  Administered 2014-01-07: 3 ug/min via INTRAVENOUS
  Filled 2014-01-06: qty 250

## 2014-01-06 MED ORDER — POTASSIUM CHLORIDE 2 MEQ/ML IV SOLN
80.0000 meq | INTRAVENOUS | Status: DC
  Filled 2014-01-06: qty 40

## 2014-01-06 MED ORDER — MAGNESIUM SULFATE 50 % IJ SOLN
40.0000 meq | INTRAMUSCULAR | Status: DC
  Filled 2014-01-06: qty 10

## 2014-01-06 MED ORDER — ALBUTEROL SULFATE (2.5 MG/3ML) 0.083% IN NEBU
2.5000 mg | INHALATION_SOLUTION | Freq: Once | RESPIRATORY_TRACT | Status: AC
  Administered 2014-01-06: 2.5 mg via RESPIRATORY_TRACT

## 2014-01-06 MED ORDER — VANCOMYCIN HCL 10 G IV SOLR
1250.0000 mg | INTRAVENOUS | Status: AC
  Administered 2014-01-07: 1250 mg via INTRAVENOUS
  Filled 2014-01-06: qty 1250

## 2014-01-06 MED ORDER — DEXTROSE 5 % IV SOLN
30.0000 ug/min | INTRAVENOUS | Status: AC
  Administered 2014-01-07: 30 ug/min via INTRAVENOUS
  Filled 2014-01-06: qty 2

## 2014-01-06 MED ORDER — EPINEPHRINE HCL 1 MG/ML IJ SOLN
0.0000 ug/min | INTRAVENOUS | Status: DC
  Filled 2014-01-06: qty 4

## 2014-01-06 MED ORDER — NITROGLYCERIN IN D5W 200-5 MCG/ML-% IV SOLN
2.0000 ug/min | INTRAVENOUS | Status: AC
  Administered 2014-01-07: 16.67 ug/min via INTRAVENOUS
  Filled 2014-01-06: qty 250

## 2014-01-06 MED ORDER — METOPROLOL TARTRATE 12.5 MG HALF TABLET
12.5000 mg | ORAL_TABLET | Freq: Once | ORAL | Status: AC
  Administered 2014-01-07: 12.5 mg via ORAL
  Filled 2014-01-06: qty 1

## 2014-01-06 NOTE — Progress Notes (Signed)
VASCULAR LAB PRELIMINARY  PRELIMINARY  PRELIMINARY  PRELIMINARY  Pre-op Cardiac Surgery  Carotid Findings:  Bilateral:  1-39% ICA stenosis.  Vertebral artery flow is antegrade.     Upper Extremity Right Left  Brachial Pressures 137 Triphasic 128 Triphasic  Radial Waveforms Triphasic Triphasic  Ulnar Waveforms Triphasic Triphasic  Palmar Arch (Allen's Test) Normal Normal   Findings:  Palmar arch evaluation - Doppler waveforms remained normal with both radial and ulnar compressions    Lower  Extremity Right Left  Dorsalis Pedis    Anterior Tibial    Posterior Tibial    Ankle/Brachial Indices      Findings:  Palpable pedal pulses bilaterally   [name], RVS 01/06/2014, 4:53 PM

## 2014-01-06 NOTE — Progress Notes (Signed)
Pts wife notified that pt should arrive at 8am.

## 2014-01-07 ENCOUNTER — Ambulatory Visit (HOSPITAL_COMMUNITY): Payer: 59 | Admitting: Certified Registered"

## 2014-01-07 ENCOUNTER — Inpatient Hospital Stay (HOSPITAL_COMMUNITY)
Admission: RE | Admit: 2014-01-07 | Discharge: 2014-01-12 | DRG: 236 | Disposition: A | Discharge status: Home / Self Care | Payer: 59 | Source: Ambulatory Visit | Attending: Cardiothoracic Surgery | Admitting: Cardiothoracic Surgery

## 2014-01-07 ENCOUNTER — Encounter (HOSPITAL_COMMUNITY): Payer: Self-pay | Admitting: Certified Registered"

## 2014-01-07 ENCOUNTER — Encounter (HOSPITAL_COMMUNITY)
Admission: RE | Disposition: A | Discharge status: Home / Self Care | Payer: 59 | Source: Ambulatory Visit | Attending: Cardiothoracic Surgery

## 2014-01-07 ENCOUNTER — Inpatient Hospital Stay (HOSPITAL_COMMUNITY): Payer: 59

## 2014-01-07 DIAGNOSIS — E780 Pure hypercholesterolemia: Secondary | ICD-10-CM | POA: Diagnosis present

## 2014-01-07 DIAGNOSIS — I071 Rheumatic tricuspid insufficiency: Secondary | ICD-10-CM | POA: Diagnosis present

## 2014-01-07 DIAGNOSIS — F419 Anxiety disorder, unspecified: Secondary | ICD-10-CM | POA: Diagnosis present

## 2014-01-07 DIAGNOSIS — I2582 Chronic total occlusion of coronary artery: Secondary | ICD-10-CM | POA: Diagnosis present

## 2014-01-07 DIAGNOSIS — I252 Old myocardial infarction: Secondary | ICD-10-CM | POA: Diagnosis not present

## 2014-01-07 DIAGNOSIS — F17210 Nicotine dependence, cigarettes, uncomplicated: Secondary | ICD-10-CM | POA: Diagnosis present

## 2014-01-07 DIAGNOSIS — I2510 Atherosclerotic heart disease of native coronary artery without angina pectoris: Secondary | ICD-10-CM | POA: Diagnosis present

## 2014-01-07 DIAGNOSIS — G629 Polyneuropathy, unspecified: Secondary | ICD-10-CM | POA: Diagnosis present

## 2014-01-07 DIAGNOSIS — Z7901 Long term (current) use of anticoagulants: Secondary | ICD-10-CM | POA: Diagnosis not present

## 2014-01-07 DIAGNOSIS — D62 Acute posthemorrhagic anemia: Secondary | ICD-10-CM | POA: Diagnosis not present

## 2014-01-07 DIAGNOSIS — I25110 Atherosclerotic heart disease of native coronary artery with unstable angina pectoris: Secondary | ICD-10-CM | POA: Diagnosis present

## 2014-01-07 DIAGNOSIS — Z951 Presence of aortocoronary bypass graft: Secondary | ICD-10-CM

## 2014-01-07 DIAGNOSIS — I340 Nonrheumatic mitral (valve) insufficiency: Secondary | ICD-10-CM | POA: Diagnosis present

## 2014-01-07 DIAGNOSIS — I10 Essential (primary) hypertension: Secondary | ICD-10-CM | POA: Diagnosis present

## 2014-01-07 DIAGNOSIS — D696 Thrombocytopenia, unspecified: Secondary | ICD-10-CM | POA: Diagnosis present

## 2014-01-07 HISTORY — PX: TEE WITHOUT CARDIOVERSION: SHX5443

## 2014-01-07 HISTORY — PX: CORONARY ARTERY BYPASS GRAFT: SHX141

## 2014-01-07 LAB — POCT I-STAT, CHEM 8
BUN: 9 mg/dL (ref 6–23)
BUN: 7 mg/dL (ref 6–23)
BUN: 8 mg/dL (ref 6–23)
BUN: 7 mg/dL (ref 6–23)
BUN: 8 mg/dL (ref 6–23)
BUN: 9 mg/dL (ref 6–23)
Calcium, Ion: 1.19 mmol/L (ref 1.12–1.23)
Calcium, Ion: 0.93 mmol/L — ABNORMAL LOW (ref 1.12–1.23)
Calcium, Ion: 1.18 mmol/L (ref 1.12–1.23)
Calcium, Ion: 0.8 mmol/L — ABNORMAL LOW (ref 1.12–1.23)
Calcium, Ion: 1.02 mmol/L — ABNORMAL LOW (ref 1.12–1.23)
Calcium, Ion: 1.11 mmol/L — ABNORMAL LOW (ref 1.12–1.23)
Chloride: 104 mEq/L (ref 96–112)
Chloride: 103 mEq/L (ref 96–112)
Chloride: 96 mEq/L (ref 96–112)
Chloride: 94 mEq/L — ABNORMAL LOW (ref 96–112)
Chloride: 96 mEq/L (ref 96–112)
Chloride: 107 mEq/L (ref 96–112)
Creatinine, Ser: 0.5 mg/dL (ref 0.50–1.35)
Creatinine, Ser: 0.4 mg/dL — ABNORMAL LOW (ref 0.50–1.35)
Creatinine, Ser: 0.4 mg/dL — ABNORMAL LOW (ref 0.50–1.35)
Creatinine, Ser: 0.6 mg/dL (ref 0.50–1.35)
Creatinine, Ser: 0.6 mg/dL (ref 0.50–1.35)
Creatinine, Ser: 0.7 mg/dL (ref 0.50–1.35)
Glucose, Bld: 104 mg/dL — ABNORMAL HIGH (ref 70–99)
Glucose, Bld: 104 mg/dL — ABNORMAL HIGH (ref 70–99)
Glucose, Bld: 86 mg/dL (ref 70–99)
Glucose, Bld: 86 mg/dL (ref 70–99)
Glucose, Bld: 112 mg/dL — ABNORMAL HIGH (ref 70–99)
Glucose, Bld: 143 mg/dL — ABNORMAL HIGH (ref 70–99)
HCT: 41 % (ref 39.0–52.0)
HCT: 27 % — ABNORMAL LOW (ref 39.0–52.0)
HCT: 37 % — ABNORMAL LOW (ref 39.0–52.0)
HCT: 25 % — ABNORMAL LOW (ref 39.0–52.0)
HCT: 27 % — ABNORMAL LOW (ref 39.0–52.0)
HCT: 38 % — ABNORMAL LOW (ref 39.0–52.0)
Hemoglobin: 13.9 g/dL (ref 13.0–17.0)
Hemoglobin: 12.6 g/dL — ABNORMAL LOW (ref 13.0–17.0)
Hemoglobin: 9.2 g/dL — ABNORMAL LOW (ref 13.0–17.0)
Hemoglobin: 8.5 g/dL — ABNORMAL LOW (ref 13.0–17.0)
Hemoglobin: 9.2 g/dL — ABNORMAL LOW (ref 13.0–17.0)
Hemoglobin: 12.9 g/dL — ABNORMAL LOW (ref 13.0–17.0)
Potassium: 4.7 mEq/L (ref 3.7–5.3)
Potassium: 4.2 mEq/L (ref 3.7–5.3)
Potassium: 4.2 mEq/L (ref 3.7–5.3)
Potassium: 4.6 mEq/L (ref 3.7–5.3)
Potassium: 4.6 mEq/L (ref 3.7–5.3)
Potassium: 4.6 mEq/L (ref 3.7–5.3)
Sodium: 138 mEq/L (ref 137–147)
Sodium: 134 mEq/L — ABNORMAL LOW (ref 137–147)
Sodium: 138 mEq/L (ref 137–147)
Sodium: 139 mEq/L (ref 137–147)
Sodium: 132 mEq/L — ABNORMAL LOW (ref 137–147)
Sodium: 140 mEq/L (ref 137–147)
TCO2: 24 mmol/L (ref 0–100)
TCO2: 19 mmol/L (ref 0–100)
TCO2: 24 mmol/L (ref 0–100)
TCO2: 43 mmol/L (ref 0–100)
TCO2: 21 mmol/L (ref 0–100)
TCO2: 20 mmol/L (ref 0–100)

## 2014-01-07 LAB — POCT I-STAT 3, ART BLOOD GAS (G3+)
Acid-base deficit: 1 mmol/L (ref 0.0–2.0)
Acid-base deficit: 3 mmol/L — ABNORMAL HIGH (ref 0.0–2.0)
Acid-base deficit: 2 mmol/L (ref 0.0–2.0)
Acid-base deficit: 4 mmol/L — ABNORMAL HIGH (ref 0.0–2.0)
Bicarbonate: 23.7 mEq/L (ref 20.0–24.0)
Bicarbonate: 22.3 mEq/L (ref 20.0–24.0)
Bicarbonate: 24.4 mEq/L — ABNORMAL HIGH (ref 20.0–24.0)
Bicarbonate: 22.1 mEq/L (ref 20.0–24.0)
O2 Saturation: 100 %
O2 Saturation: 99 %
O2 Saturation: 98 %
O2 Saturation: 96 %
Patient temperature: 36.5
Patient temperature: 36.8
Patient temperature: 37
TCO2: 25 mmol/L (ref 0–100)
TCO2: 23 mmol/L (ref 0–100)
TCO2: 26 mmol/L (ref 0–100)
TCO2: 23 mmol/L (ref 0–100)
pCO2 arterial: 40.8 mmHg (ref 35.0–45.0)
pCO2 arterial: 39.4 mmHg (ref 35.0–45.0)
pCO2 arterial: 47 mmHg — ABNORMAL HIGH (ref 35.0–45.0)
pCO2 arterial: 44.1 mmHg (ref 35.0–45.0)
pH, Arterial: 7.372 (ref 7.350–7.450)
pH, Arterial: 7.358 (ref 7.350–7.450)
pH, Arterial: 7.323 — ABNORMAL LOW (ref 7.350–7.450)
pH, Arterial: 7.308 — ABNORMAL LOW (ref 7.350–7.450)
pO2, Arterial: 281 mmHg — ABNORMAL HIGH (ref 80.0–100.0)
pO2, Arterial: 125 mmHg — ABNORMAL HIGH (ref 80.0–100.0)
pO2, Arterial: 106 mmHg — ABNORMAL HIGH (ref 80.0–100.0)
pO2, Arterial: 87 mmHg (ref 80.0–100.0)

## 2014-01-07 LAB — CBC
HCT: 35.5 % — ABNORMAL LOW (ref 39.0–52.0)
HCT: 39 % (ref 39.0–52.0)
Hemoglobin: 12.3 g/dL — ABNORMAL LOW (ref 13.0–17.0)
Hemoglobin: 13.5 g/dL (ref 13.0–17.0)
MCH: 30.2 pg (ref 26.0–34.0)
MCH: 30.3 pg (ref 26.0–34.0)
MCHC: 34.6 g/dL (ref 30.0–36.0)
MCHC: 34.6 g/dL (ref 30.0–36.0)
MCV: 87.2 fL (ref 78.0–100.0)
MCV: 87.4 fL (ref 78.0–100.0)
Platelets: 97 10*3/uL — ABNORMAL LOW (ref 150–400)
Platelets: 129 10*3/uL — ABNORMAL LOW (ref 150–400)
RBC: 4.07 MIL/uL — ABNORMAL LOW (ref 4.22–5.81)
RBC: 4.46 MIL/uL (ref 4.22–5.81)
RDW: 13 % (ref 11.5–15.5)
RDW: 13.3 % (ref 11.5–15.5)
WBC: 8.6 10*3/uL (ref 4.0–10.5)
WBC: 13.4 10*3/uL — ABNORMAL HIGH (ref 4.0–10.5)

## 2014-01-07 LAB — GLUCOSE, CAPILLARY
Glucose-Capillary: 106 mg/dL — ABNORMAL HIGH (ref 70–99)
Glucose-Capillary: 107 mg/dL — ABNORMAL HIGH (ref 70–99)
Glucose-Capillary: 107 mg/dL — ABNORMAL HIGH (ref 70–99)
Glucose-Capillary: 131 mg/dL — ABNORMAL HIGH (ref 70–99)
Glucose-Capillary: 151 mg/dL — ABNORMAL HIGH (ref 70–99)

## 2014-01-07 LAB — POCT I-STAT 4, (NA,K, GLUC, HGB,HCT)
Glucose, Bld: 106 mg/dL — ABNORMAL HIGH (ref 70–99)
HCT: 34 % — ABNORMAL LOW (ref 39.0–52.0)
Hemoglobin: 11.6 g/dL — ABNORMAL LOW (ref 13.0–17.0)
Potassium: 4.1 mEq/L (ref 3.7–5.3)
Sodium: 137 mEq/L (ref 137–147)

## 2014-01-07 LAB — CREATININE, SERUM
Creatinine, Ser: 0.73 mg/dL (ref 0.50–1.35)
GFR calc Af Amer: >90 mL/min (ref >90)
GFR calc non Af Amer: >90 mL/min (ref >90)

## 2014-01-07 LAB — APTT: aPTT: 34 seconds (ref 24–37)

## 2014-01-07 LAB — HEMOGLOBIN AND HEMATOCRIT, BLOOD
HCT: 25.8 % — ABNORMAL LOW (ref 39.0–52.0)
Hemoglobin: 9.2 g/dL — ABNORMAL LOW (ref 13.0–17.0)

## 2014-01-07 LAB — PROTIME-INR
INR: 1.44 (ref 0.00–1.49)
PROTHROMBIN TIME: 17.7 s — AB (ref 11.6–15.2)

## 2014-01-07 LAB — PLATELET COUNT: Platelets: 109 10*3/uL — ABNORMAL LOW (ref 150–400)

## 2014-01-07 LAB — MAGNESIUM: Magnesium: 3.2 mg/dL — ABNORMAL HIGH (ref 1.5–2.5)

## 2014-01-07 SURGERY — CORONARY ARTERY BYPASS GRAFTING (CABG)
Anesthesia: General | Site: Esophagus

## 2014-01-07 MED ORDER — VANCOMYCIN HCL IN DEXTROSE 1-5 GM/200ML-% IV SOLN
1000.0000 mg | Freq: Once | INTRAVENOUS | Status: AC
  Administered 2014-01-07: 1000 mg via INTRAVENOUS
  Filled 2014-01-07: qty 200

## 2014-01-07 MED ORDER — CHLORHEXIDINE GLUCONATE 0.12 % MT SOLN
15.0000 mL | Freq: Once | OROMUCOSAL | Status: AC
  Administered 2014-01-07: 15 mL via OROMUCOSAL
  Filled 2014-01-07: qty 15

## 2014-01-07 MED ORDER — SUCCINYLCHOLINE CHLORIDE 20 MG/ML IJ SOLN
INTRAMUSCULAR | Status: DC | PRN
  Administered 2014-01-07: 100 mg via INTRAVENOUS

## 2014-01-07 MED ORDER — PROTAMINE SULFATE 10 MG/ML IV SOLN
INTRAVENOUS | Status: AC
  Filled 2014-01-07: qty 25

## 2014-01-07 MED ORDER — DEXMEDETOMIDINE HCL IN NACL 200 MCG/50ML IV SOLN: 0.0000 ug/kg/h | INTRAVENOUS | Status: DC

## 2014-01-07 MED ORDER — MILRINONE IN DEXTROSE 20 MG/100ML IV SOLN: 0.3750 ug/kg/min | INTRAVENOUS | Status: DC

## 2014-01-07 MED ORDER — LACTATED RINGERS IV SOLN
INTRAVENOUS | Status: DC
  Administered 2014-01-07 (×2): via INTRAVENOUS

## 2014-01-07 MED ORDER — PANTOPRAZOLE SODIUM 40 MG PO TBEC
40.0000 mg | DELAYED_RELEASE_TABLET | Freq: Every day | ORAL | Status: DC
  Administered 2014-01-09 – 2014-01-12 (×3): 40 mg via ORAL
  Filled 2014-01-07 (×4): qty 1

## 2014-01-07 MED ORDER — FENTANYL CITRATE 0.05 MG/ML IJ SOLN
INTRAMUSCULAR | Status: AC
  Filled 2014-01-07: qty 5

## 2014-01-07 MED ORDER — DOCUSATE SODIUM 100 MG PO CAPS
200.0000 mg | ORAL_CAPSULE | Freq: Every day | ORAL | Status: DC
  Administered 2014-01-08 – 2014-01-11 (×4): 200 mg via ORAL
  Filled 2014-01-07 (×5): qty 2

## 2014-01-07 MED ORDER — NITROGLYCERIN IN D5W 200-5 MCG/ML-% IV SOLN: 0.0000 ug/min | INTRAVENOUS | Status: DC

## 2014-01-07 MED ORDER — MIDAZOLAM HCL 10 MG/2ML IJ SOLN
INTRAMUSCULAR | Status: AC
  Filled 2014-01-07: qty 2

## 2014-01-07 MED ORDER — LACTATED RINGERS IV SOLN: 500.0000 mL | Freq: Once | INTRAVENOUS | Status: AC | PRN

## 2014-01-07 MED ORDER — SODIUM CHLORIDE 0.9 % IJ SOLN: 3.0000 mL | INTRAMUSCULAR | Status: DC | PRN

## 2014-01-07 MED ORDER — MORPHINE SULFATE 2 MG/ML IJ SOLN
2.0000 mg | INTRAMUSCULAR | Status: DC | PRN
  Administered 2014-01-07: 2 mg via INTRAVENOUS
  Administered 2014-01-08: 4 mg via INTRAVENOUS
  Administered 2014-01-08 – 2014-01-09 (×5): 2 mg via INTRAVENOUS
  Filled 2014-01-07 (×5): qty 1
  Filled 2014-01-07: qty 2
  Filled 2014-01-07: qty 1

## 2014-01-07 MED ORDER — ASPIRIN 81 MG PO CHEW: 324.0000 mg | CHEWABLE_TABLET | Freq: Every day | ORAL | Status: DC

## 2014-01-07 MED ORDER — ROCURONIUM BROMIDE 100 MG/10ML IV SOLN
INTRAVENOUS | Status: DC | PRN
  Administered 2014-01-07 (×2): 50 mg via INTRAVENOUS

## 2014-01-07 MED ORDER — MILRINONE IN DEXTROSE 20 MG/100ML IV SOLN
0.3750 ug/kg/min | INTRAVENOUS | Status: AC
  Administered 2014-01-07: .3 ug/kg/min via INTRAVENOUS
  Filled 2014-01-07: qty 100

## 2014-01-07 MED ORDER — SODIUM CHLORIDE 0.9 % IJ SOLN
INTRAMUSCULAR | Status: AC
  Filled 2014-01-07: qty 10

## 2014-01-07 MED ORDER — ARTIFICIAL TEARS OP OINT
TOPICAL_OINTMENT | OPHTHALMIC | Status: DC | PRN
Start: 2014-01-07 — End: 2014-01-07
  Administered 2014-01-07: 1 via OPHTHALMIC

## 2014-01-07 MED ORDER — SODIUM CHLORIDE 0.9 % IJ SOLN
OROMUCOSAL | Status: DC | PRN
  Administered 2014-01-07: 4 mL via TOPICAL

## 2014-01-07 MED ORDER — MILRINONE IN DEXTROSE 20 MG/100ML IV SOLN
0.1250 ug/kg/min | INTRAVENOUS | Status: AC
  Administered 2014-01-07: 0.3 ug/kg/min via INTRAVENOUS
  Filled 2014-01-07: qty 100

## 2014-01-07 MED ORDER — METOPROLOL TARTRATE 25 MG/10 ML ORAL SUSPENSION
12.5000 mg | Freq: Two times a day (BID) | ORAL | Status: DC
  Administered 2014-01-08: 12.5 mg
  Filled 2014-01-07 (×11): qty 5

## 2014-01-07 MED ORDER — METOPROLOL TARTRATE 1 MG/ML IV SOLN: 2.5000 mg | INTRAVENOUS | Status: DC | PRN

## 2014-01-07 MED ORDER — SODIUM CHLORIDE 0.9 % IV SOLN
INTRAVENOUS | Status: DC
  Filled 2014-01-07: qty 2.5

## 2014-01-07 MED ORDER — DEXTROSE 5 % IV SOLN
1.5000 g | Freq: Two times a day (BID) | INTRAVENOUS | Status: AC
  Administered 2014-01-07 – 2014-01-09 (×4): 1.5 g via INTRAVENOUS
  Filled 2014-01-07 (×4): qty 1.5

## 2014-01-07 MED ORDER — STERILE WATER FOR INJECTION IJ SOLN
INTRAMUSCULAR | Status: AC
  Filled 2014-01-07: qty 10

## 2014-01-07 MED ORDER — POTASSIUM CHLORIDE 10 MEQ/50ML IV SOLN: 10.0000 meq | INTRAVENOUS | Status: DC

## 2014-01-07 MED ORDER — 0.9 % SODIUM CHLORIDE (POUR BTL) OPTIME
TOPICAL | Status: DC | PRN
  Administered 2014-01-07: 2000 mL

## 2014-01-07 MED ORDER — ONDANSETRON HCL 4 MG/2ML IJ SOLN
4.0000 mg | Freq: Four times a day (QID) | INTRAMUSCULAR | Status: DC | PRN
  Administered 2014-01-07 – 2014-01-09 (×3): 4 mg via INTRAVENOUS
  Filled 2014-01-07 (×3): qty 2

## 2014-01-07 MED ORDER — HEMOSTATIC AGENTS (NO CHARGE) OPTIME
TOPICAL | Status: DC | PRN
  Administered 2014-01-07: 1 via TOPICAL

## 2014-01-07 MED ORDER — MORPHINE SULFATE 2 MG/ML IJ SOLN: 1.0000 mg | INTRAMUSCULAR | Status: AC | PRN

## 2014-01-07 MED ORDER — MILRINONE IN DEXTROSE 20 MG/100ML IV SOLN: 0.2500 ug/kg/min | INTRAVENOUS | Status: DC

## 2014-01-07 MED ORDER — PROTAMINE SULFATE 10 MG/ML IV SOLN
INTRAVENOUS | Status: DC | PRN
  Administered 2014-01-07: 50 mg via INTRAVENOUS
  Administered 2014-01-07: 20 mg via INTRAVENOUS
  Administered 2014-01-07: 25 mg via INTRAVENOUS
  Administered 2014-01-07: 50 mg via INTRAVENOUS
  Administered 2014-01-07 (×2): 30 mg via INTRAVENOUS
  Administered 2014-01-07: 50 mg via INTRAVENOUS

## 2014-01-07 MED ORDER — OXYCODONE HCL 5 MG PO TABS
5.0000 mg | ORAL_TABLET | ORAL | Status: DC | PRN
  Administered 2014-01-07 – 2014-01-11 (×8): 10 mg via ORAL
  Filled 2014-01-07 (×9): qty 2

## 2014-01-07 MED ORDER — MAGNESIUM SULFATE 4 GM/100ML IV SOLN
4.0000 g | Freq: Once | INTRAVENOUS | Status: AC
  Administered 2014-01-07: 4 g via INTRAVENOUS
  Filled 2014-01-07: qty 100

## 2014-01-07 MED ORDER — FAMOTIDINE IN NACL 20-0.9 MG/50ML-% IV SOLN: 20.0000 mg | Freq: Two times a day (BID) | INTRAVENOUS | Status: DC

## 2014-01-07 MED ORDER — METOPROLOL TARTRATE 12.5 MG HALF TABLET
12.5000 mg | ORAL_TABLET | Freq: Two times a day (BID) | ORAL | Status: DC
  Administered 2014-01-08 – 2014-01-12 (×8): 12.5 mg via ORAL
  Filled 2014-01-07 (×12): qty 1

## 2014-01-07 MED ORDER — VECURONIUM BROMIDE 10 MG IV SOLR
INTRAVENOUS | Status: DC | PRN
  Administered 2014-01-07 (×2): 5 mg via INTRAVENOUS

## 2014-01-07 MED ORDER — SODIUM CHLORIDE 0.45 % IV SOLN
INTRAVENOUS | Status: DC
  Administered 2014-01-07: 20 mL/h via INTRAVENOUS

## 2014-01-07 MED ORDER — MILRINONE LOAD VIA INFUSION
INTRAVENOUS | Status: DC | PRN
  Administered 2014-01-07: 3600 ug via INTRAVENOUS

## 2014-01-07 MED ORDER — VECURONIUM BROMIDE 10 MG IV SOLR
INTRAVENOUS | Status: AC
  Filled 2014-01-07: qty 10

## 2014-01-07 MED ORDER — BISACODYL 10 MG RE SUPP: 10.0000 mg | Freq: Every day | RECTAL | Status: DC

## 2014-01-07 MED ORDER — SODIUM CHLORIDE 0.9 % IV SOLN: 250.0000 mL | INTRAVENOUS | Status: DC

## 2014-01-07 MED ORDER — SODIUM CHLORIDE 0.9 % IV SOLN
INTRAVENOUS | Status: DC | PRN
  Administered 2014-01-07: 13:00:00 via INTRAVENOUS

## 2014-01-07 MED ORDER — ACETAMINOPHEN 650 MG RE SUPP
650.0000 mg | Freq: Once | RECTAL | Status: AC
  Administered 2014-01-07: 650 mg via RECTAL

## 2014-01-07 MED ORDER — ACETAMINOPHEN 160 MG/5ML PO SOLN: 650.0000 mg | Freq: Once | ORAL | Status: AC

## 2014-01-07 MED ORDER — BISACODYL 5 MG PO TBEC
10.0000 mg | DELAYED_RELEASE_TABLET | Freq: Every day | ORAL | Status: DC
  Administered 2014-01-08 – 2014-01-10 (×3): 10 mg via ORAL
  Filled 2014-01-07 (×4): qty 2

## 2014-01-07 MED ORDER — FENTANYL CITRATE 0.05 MG/ML IJ SOLN
INTRAMUSCULAR | Status: DC | PRN
  Administered 2014-01-07: 250 ug via INTRAVENOUS
  Administered 2014-01-07: 500 ug via INTRAVENOUS
  Administered 2014-01-07: 100 ug via INTRAVENOUS
  Administered 2014-01-07 (×3): 50 ug via INTRAVENOUS
  Administered 2014-01-07: 250 ug via INTRAVENOUS

## 2014-01-07 MED ORDER — ASPIRIN EC 325 MG PO TBEC
325.0000 mg | DELAYED_RELEASE_TABLET | Freq: Every day | ORAL | Status: DC
  Administered 2014-01-08 – 2014-01-12 (×5): 325 mg via ORAL
  Filled 2014-01-07 (×5): qty 1

## 2014-01-07 MED ORDER — DOPAMINE-DEXTROSE 3.2-5 MG/ML-% IV SOLN: 2.5000 ug/kg/min | INTRAVENOUS | Status: AC

## 2014-01-07 MED ORDER — INSULIN ASPART 100 UNIT/ML ~~LOC~~ SOLN
2.0000 [IU] | SUBCUTANEOUS | Status: DC
  Administered 2014-01-07: 4 [IU] via SUBCUTANEOUS
  Administered 2014-01-07: 2 [IU] via SUBCUTANEOUS

## 2014-01-07 MED ORDER — INFLUENZA VAC SPLIT QUAD 0.5 ML IM SUSY
0.5000 mL | PREFILLED_SYRINGE | INTRAMUSCULAR | Status: AC
  Administered 2014-01-12: 0.5 mL via INTRAMUSCULAR
  Filled 2014-01-07 (×2): qty 0.5

## 2014-01-07 MED ORDER — ALBUMIN HUMAN 5 % IV SOLN
INTRAVENOUS | Status: DC | PRN
  Administered 2014-01-07 (×2): via INTRAVENOUS

## 2014-01-07 MED ORDER — SODIUM CHLORIDE 0.9 % IJ SOLN
3.0000 mL | Freq: Two times a day (BID) | INTRAMUSCULAR | Status: DC
  Administered 2014-01-08 – 2014-01-11 (×8): 3 mL via INTRAVENOUS

## 2014-01-07 MED ORDER — ACETAMINOPHEN 500 MG PO TABS
1000.0000 mg | ORAL_TABLET | Freq: Four times a day (QID) | ORAL | Status: DC
  Administered 2014-01-07 – 2014-01-12 (×16): 1000 mg via ORAL
  Filled 2014-01-07 (×22): qty 2

## 2014-01-07 MED ORDER — PROPOFOL 10 MG/ML IV BOLUS
INTRAVENOUS | Status: DC | PRN
  Administered 2014-01-07: 50 mg via INTRAVENOUS

## 2014-01-07 MED ORDER — DOPAMINE-DEXTROSE 3.2-5 MG/ML-% IV SOLN: 2.5000 ug/kg/min | INTRAVENOUS | Status: DC

## 2014-01-07 MED ORDER — LIDOCAINE HCL (CARDIAC) 20 MG/ML IV SOLN
INTRAVENOUS | Status: AC
  Filled 2014-01-07: qty 5

## 2014-01-07 MED ORDER — SODIUM CHLORIDE 0.9 % IV SOLN
INTRAVENOUS | Status: DC
  Administered 2014-01-07: 20 mL via INTRAVENOUS

## 2014-01-07 MED ORDER — ALBUMIN HUMAN 5 % IV SOLN: 250.0000 mL | INTRAVENOUS | Status: AC | PRN

## 2014-01-07 MED ORDER — SIMVASTATIN 20 MG PO TABS
20.0000 mg | ORAL_TABLET | Freq: Every day | ORAL | Status: DC
Start: 2014-01-07 — End: 2014-01-12
  Administered 2014-01-08 – 2014-01-11 (×4): 20 mg via ORAL
  Filled 2014-01-07 (×7): qty 1

## 2014-01-07 MED ORDER — CHLORHEXIDINE GLUCONATE 0.12 % MT SOLN
15.0000 mL | Freq: Two times a day (BID) | OROMUCOSAL | Status: DC
  Administered 2014-01-07: 15 mL via OROMUCOSAL
  Filled 2014-01-07: qty 15

## 2014-01-07 MED ORDER — INSULIN REGULAR BOLUS VIA INFUSION
0.0000 [IU] | Freq: Three times a day (TID) | INTRAVENOUS | Status: DC
  Filled 2014-01-07: qty 10

## 2014-01-07 MED ORDER — ARTIFICIAL TEARS OP OINT
TOPICAL_OINTMENT | OPHTHALMIC | Status: AC
  Filled 2014-01-07: qty 3.5

## 2014-01-07 MED ORDER — HEPARIN SODIUM (PORCINE) 1000 UNIT/ML IJ SOLN
INTRAMUSCULAR | Status: DC | PRN
  Administered 2014-01-07: 23000 [IU] via INTRAVENOUS

## 2014-01-07 MED ORDER — CETYLPYRIDINIUM CHLORIDE 0.05 % MT LIQD
7.0000 mL | Freq: Two times a day (BID) | OROMUCOSAL | Status: DC
  Administered 2014-01-07 – 2014-01-10 (×5): 7 mL via OROMUCOSAL

## 2014-01-07 MED ORDER — PHENYLEPHRINE HCL 10 MG/ML IJ SOLN
0.0000 ug/min | INTRAMUSCULAR | Status: DC
  Filled 2014-01-07: qty 2

## 2014-01-07 MED ORDER — PNEUMOCOCCAL VAC POLYVALENT 25 MCG/0.5ML IJ INJ
0.5000 mL | INJECTION | INTRAMUSCULAR | Status: AC
  Administered 2014-01-12: 0.5 mL via INTRAMUSCULAR
  Filled 2014-01-07 (×2): qty 0.5

## 2014-01-07 MED ORDER — MIDAZOLAM HCL 2 MG/2ML IJ SOLN
2.0000 mg | INTRAMUSCULAR | Status: DC | PRN
  Administered 2014-01-07: 2 mg via INTRAVENOUS
  Filled 2014-01-07: qty 2

## 2014-01-07 MED ORDER — LACTATED RINGERS IV SOLN: INTRAVENOUS | Status: DC

## 2014-01-07 MED ORDER — LIDOCAINE HCL (CARDIAC) 20 MG/ML IV SOLN
INTRAVENOUS | Status: DC | PRN
  Administered 2014-01-07: 60 mg via INTRAVENOUS

## 2014-01-07 MED ORDER — TRAMADOL HCL 50 MG PO TABS
50.0000 mg | ORAL_TABLET | ORAL | Status: DC | PRN
  Administered 2014-01-07 – 2014-01-09 (×6): 100 mg via ORAL
  Filled 2014-01-07 (×6): qty 2

## 2014-01-07 MED ORDER — MIDAZOLAM HCL 5 MG/5ML IJ SOLN
INTRAMUSCULAR | Status: DC | PRN
  Administered 2014-01-07 (×5): 2 mg via INTRAVENOUS

## 2014-01-07 MED ORDER — ACETAMINOPHEN 160 MG/5ML PO SOLN
1000.0000 mg | Freq: Four times a day (QID) | ORAL | Status: DC
  Filled 2014-01-07: qty 40

## 2014-01-07 MED FILL — Lidocaine HCl IV Inj 20 MG/ML: INTRAVENOUS | Qty: 5 | Status: AC

## 2014-01-07 MED FILL — Heparin Sodium (Porcine) Inj 1000 Unit/ML: INTRAMUSCULAR | Qty: 10 | Status: AC

## 2014-01-07 MED FILL — Mannitol IV Soln 20%: INTRAVENOUS | Qty: 500 | Status: AC

## 2014-01-07 MED FILL — Sodium Bicarbonate IV Soln 8.4%: INTRAVENOUS | Qty: 50 | Status: AC

## 2014-01-07 MED FILL — Electrolyte-R (PH 7.4) Solution: INTRAVENOUS | Qty: 1000 | Status: AC

## 2014-01-07 MED FILL — Sodium Chloride IV Soln 0.9%: INTRAVENOUS | Qty: 2000 | Status: AC

## 2014-01-07 SURGICAL SUPPLY — 69 items
ATTRACTOMAT 16X20 MAGNETIC DRP (DRAPES) ×3 IMPLANT
BAG DECANTER FOR FLEXI CONT (MISCELLANEOUS) ×3 IMPLANT
BANDAGE ELASTIC 4 VELCRO ST LF (GAUZE/BANDAGES/DRESSINGS) ×3 IMPLANT
BANDAGE ELASTIC 6 VELCRO ST LF (GAUZE/BANDAGES/DRESSINGS) ×3 IMPLANT
BLADE STERNUM SYSTEM 6 (BLADE) ×3 IMPLANT
BLADE SURG 11 STRL SS (BLADE) ×3 IMPLANT
BNDG GAUZE ELAST 4 BULKY (GAUZE/BANDAGES/DRESSINGS) ×3 IMPLANT
CANISTER SUCTION 2500CC (MISCELLANEOUS) ×3 IMPLANT
CARDIAC SUCTION (MISCELLANEOUS) ×3 IMPLANT
CATH CPB KIT GERHARDT (MISCELLANEOUS) ×3 IMPLANT
CATH THORACIC 28FR (CATHETERS) ×3 IMPLANT
COVER SURGICAL LIGHT HANDLE (MISCELLANEOUS) ×3 IMPLANT
CRADLE DONUT ADULT HEAD (MISCELLANEOUS) ×3 IMPLANT
DRAIN CHANNEL 28F RND 3/8 FF (WOUND CARE) ×3 IMPLANT
DRAPE CARDIOVASCULAR INCISE (DRAPES) ×1
DRAPE SLUSH/WARMER DISC (DRAPES) ×3 IMPLANT
DRAPE SRG 135X102X78XABS (DRAPES) ×2 IMPLANT
DRSG AQUACEL AG ADV 3.5X14 (GAUZE/BANDAGES/DRESSINGS) ×3 IMPLANT
ELECT BLADE 4.0 EZ CLEAN MEGAD (MISCELLANEOUS) ×3
ELECT REM PT RETURN 9FT ADLT (ELECTROSURGICAL) ×6
ELECTRODE BLDE 4.0 EZ CLN MEGD (MISCELLANEOUS) ×2 IMPLANT
ELECTRODE REM PT RTRN 9FT ADLT (ELECTROSURGICAL) ×4 IMPLANT
GAUZE SPONGE 4X4 12PLY STRL (GAUZE/BANDAGES/DRESSINGS) ×6 IMPLANT
GLOVE BIO SURGEON STRL SZ 6.5 (GLOVE) ×9 IMPLANT
GOWN STRL REUS W/ TWL LRG LVL3 (GOWN DISPOSABLE) ×8 IMPLANT
GOWN STRL REUS W/TWL LRG LVL3 (GOWN DISPOSABLE) ×4
HEMOSTAT POWDER SURGIFOAM 1G (HEMOSTASIS) ×9 IMPLANT
HEMOSTAT SURGICEL 2X14 (HEMOSTASIS) ×6 IMPLANT
KIT BASIN OR (CUSTOM PROCEDURE TRAY) ×3 IMPLANT
KIT ROOM TURNOVER OR (KITS) ×3 IMPLANT
KIT SUCTION CATH 14FR (SUCTIONS) ×6 IMPLANT
KIT VASOVIEW W/TROCAR VH 2000 (KITS) ×3 IMPLANT
LEAD PACING MYOCARDI (MISCELLANEOUS) ×6 IMPLANT
MARKER GRAFT CORONARY BYPASS (MISCELLANEOUS) ×9 IMPLANT
NS IRRIG 1000ML POUR BTL (IV SOLUTION) ×15 IMPLANT
PACK OPEN HEART (CUSTOM PROCEDURE TRAY) ×3 IMPLANT
PAD ARMBOARD 7.5X6 YLW CONV (MISCELLANEOUS) ×6 IMPLANT
PAD ELECT DEFIB RADIOL ZOLL (MISCELLANEOUS) ×3 IMPLANT
PENCIL BUTTON HOLSTER BLD 10FT (ELECTRODE) ×3 IMPLANT
PUNCH AORTIC ROTATE 4.0MM (MISCELLANEOUS) ×3 IMPLANT
SET CARDIOPLEGIA MPS 5001102 (MISCELLANEOUS) ×3 IMPLANT
SPONGE GAUZE 4X4 12PLY STER LF (GAUZE/BANDAGES/DRESSINGS) ×6 IMPLANT
SUT BONE WAX W31G (SUTURE) ×3 IMPLANT
SUT MNCRL AB 4-0 PS2 18 (SUTURE) ×3 IMPLANT
SUT PROLENE 3 0 SH1 36 (SUTURE) ×9 IMPLANT
SUT PROLENE 4 0 RB 1 (SUTURE) ×1
SUT PROLENE 4 0 TF (SUTURE) ×6 IMPLANT
SUT PROLENE 4-0 RB1 .5 CRCL 36 (SUTURE) ×2 IMPLANT
SUT PROLENE 6 0 CC (SUTURE) ×6 IMPLANT
SUT PROLENE 7 0 BV 1 (SUTURE) ×3 IMPLANT
SUT PROLENE 7 0 BV1 MDA (SUTURE) ×3 IMPLANT
SUT PROLENE 8 0 BV175 6 (SUTURE) ×9 IMPLANT
SUT SILK  1 MH (SUTURE) ×2
SUT SILK 1 MH (SUTURE) ×4 IMPLANT
SUT SILK 2 0 SH CR/8 (SUTURE) ×6 IMPLANT
SUT SILK 3 0 SH CR/8 (SUTURE) ×3 IMPLANT
SUT STEEL 6MS V (SUTURE) ×3 IMPLANT
SUT STEEL SZ 6 DBL 3X14 BALL (SUTURE) ×3 IMPLANT
SUT VIC AB 1 CTX 18 (SUTURE) ×6 IMPLANT
SUTURE E-PAK OPEN HEART (SUTURE) IMPLANT
SYSTEM SAHARA CHEST DRAIN ATS (WOUND CARE) ×3 IMPLANT
TAPE CLOTH SURG 4X10 WHT LF (GAUZE/BANDAGES/DRESSINGS) ×3 IMPLANT
TOWEL OR 17X24 6PK STRL BLUE (TOWEL DISPOSABLE) ×6 IMPLANT
TOWEL OR 17X26 10 PK STRL BLUE (TOWEL DISPOSABLE) ×6 IMPLANT
TRAY FOLEY IC TEMP SENS 16FR (CATHETERS) ×3 IMPLANT
TUBE FEEDING 8FR 16IN STR KANG (MISCELLANEOUS) ×3 IMPLANT
TUBING INSUFFLATION (TUBING) ×3 IMPLANT
UNDERPAD 30X30 INCONTINENT (UNDERPADS AND DIAPERS) ×3 IMPLANT
WATER STERILE IRR 1000ML POUR (IV SOLUTION) ×6 IMPLANT

## 2014-01-07 NOTE — H&P (Signed)
[address]       [place] [address]             [phone]                    [facility] Medical Record #[id number] Date of Birth: [birth year]  Referring: Dr Santo Held, [place]  Primary Care: Pcp Not In System  Chief Complaint:    CAG with angina   History of Present Illness:    [name] 53 y.o. male is seen in the office for consideration of coronary artery bypass graft. The patient has approximately 2 year history of on and off episodes of shortness of breath and chest discomfort radiating to the neck brought on with activity. Recently he had a more severe episode associated with more anxiety at the urging of his family went to urgent care in [place] was then referred to the [facility] emergency room and then referred as an outpatient to Dr. Park Breed. An outpatient stress test echocardiogram and cardiac CT scan were performed. Patient was noted to have moderately depressed LV systolic function with severe hypokinetic to akinetic apex mild tricuspid regurgitation and mild mitral regurgitation. Patient had ejection fraction of 40% with diffuse hypokinesis large apical septal and inferior fixed defect on myocardial perfusion study.  The patient has definite exertionally-related anginal symptoms, present for at least 2 years but progressively becoming more severe. 2-3 years ago he remembers a specific episode while loading a car onto a truck where he had very severe prolonged chest discomfort but did not seek medical attention at that time.   The patient denies diabetes he had been a long-term smoker for more than 30 years currently smokes occasional cigar.   Current Activity/ Functional Status:  Patient is independent with mobility/ambulation, transfers, ADL's, IADL's.   Zubrod Score: At the time of surgery this patient's most appropriate activity status/level should be described as: []     0    Normal activity, no symptoms [x]     1    Restricted  in physical strenuous activity but ambulatory, able to do out light work []     2    Ambulatory and capable of self care, unable to do work activities, up and about               >50 % of waking hours                              []     3    Only limited self care, in bed greater than 50% of waking hours []     4    Completely disabled, no self care, confined to bed or chair []     5    Moribund   Past Medical History  Diagnosis Date  . Myocardial infarction   . Anginal pain   . Hypertension   . Anxiety   . Neuropathy     Past Surgical History  Procedure Laterality Date  . Foot surgery      right  . Colonoscopy      Family History  Problem Relation Age of Onset  . Diabetes Father   . Diabetes Sister   . Cancer Paternal Grandmother   . Mental illness Sister    patient's father died at age 79 with cirrhosis of the liver is mother is age 91 and healthy  History   Social History  .  Marital Status: Married    Spouse Name: N/A    Number of Children: 2  . Years of Education: N/A   Occupational History  . 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 Smoking status: Current Every Day Smoker  . Smokeless tobacco: Not on file  . Alcohol Use: No  . Drug Use: Yes    Special: Marijuana  . Sexual Activity: Not on file   Other Topics Concern  . Not on file   Social History Narrative    History  Smoking status  . Current Every Day Smoker -- 35 years  . Types: Cigars  Smokeless tobacco  . Not on file    History  Alcohol Use No     No Known Allergies  Current Facility-Administered Medications  Medication Dose Route Frequency Provider Last Rate Last Dose  . aminocaproic acid (AMICAR) 10 g in sodium chloride 0.9 % 100 mL infusion   Intravenous To OR Delight OvensEdward B Judy Goodenow, MD      . cefUROXime (ZINACEF) 1.5 g in dextrose 5 % 50 mL IVPB  1.5 g Intravenous To OR Delight OvensEdward B Oiva Dibari, MD      . cefUROXime (ZINACEF) 750 mg in dextrose 5 % 50 mL IVPB   750 mg Intravenous To OR Delight OvensEdward B Roman Sandall, MD      . chlorhexidine (HIBICLENS) 4 % liquid 2 application  30 mL Topical UD Delight OvensEdward B Kregg Cihlar, MD      . dexmedetomidine (PRECEDEX) 400 MCG/100ML (4 mcg/mL) infusion  0.1-0.7 mcg/kg/hr Intravenous To OR Delight OvensEdward B Cassandre Oleksy, MD      . DOPamine (INTROPIN) 800 mg in dextrose 5 % 250 mL (3.2 mg/mL) infusion  0-10 mcg/kg/min Intravenous To OR Delight OvensEdward B Lee-Anne Flicker, MD      . EPINEPHrine (ADRENALIN) 4 mg in dextrose 5 % 250 mL (0.016 mg/mL) infusion  0-10 mcg/min Intravenous To OR Delight OvensEdward B Malita Ignasiak, MD      . heparin 2,500 Units, papaverine 30 mg in electrolyte-148 (PLASMALYTE-148) 500 mL irrigation   Irrigation To OR Delight OvensEdward B Laikyn Gewirtz, MD      . heparin 30,000 units/NS 1000 mL solution for CELLSAVER   Other To OR Delight OvensEdward B Aniel Hubble, MD      . insulin regular (NOVOLIN R,HUMULIN R) 250 Units in sodium chloride 0.9 % 250 mL (1 Units/mL) infusion   Intravenous To OR Delight OvensEdward B Omaira Mellen, MD      . lactated ringers infusion   Intravenous Continuous Rosezella FloridaWilliam E Fitzgerald, MD 50 mL/hr at 01/07/14 [phone]    . magnesium sulfate (IV Push/IM) injection 40 mEq  40 mEq Other To OR Delight OvensEdward B Tamaiya Bump, MD      . nitroGLYCERIN 50 mg in dextrose 5 % 250 mL (0.2 mg/mL) infusion  2-200 mcg/min Intravenous To OR Delight OvensEdward B Aubery Date, MD      . phenylephrine (NEO-SYNEPHRINE) 20 mg in dextrose 5 % 250 mL (0.08 mg/mL) infusion  30-200 mcg/min Intravenous To OR Delight OvensEdward B Ravinder Lukehart, MD      . potassium chloride injection 80 mEq  80 mEq Other To OR Delight OvensEdward B Chesky Heyer, MD      . vancomycin (VANCOCIN) 1,250 mg in sodium chloride 0.9 % 250 mL IVPB  1,250 mg Intravenous To OR Delight OvensEdward B Guinevere Stephenson, MD       Facility-Administered Medications Ordered in Other Encounters  Medication Dose Route Frequency Provider Last Rate Last Dose  . fentaNYL (SUBLIMAZE) injection    Anesthesia Intra-op Quentin OreLuz E Walker, CRNA  50 mcg at 01/07/14 0845  . midazolam (VERSED) 5 MG/5ML injection    Anesthesia Intra-op Quentin Ore,  CRNA   2 mg at 01/07/14 0845     Review of Systems:     Cardiac Review of Systems: Y or N  Chest Pain [  y  ]  Resting SOB [ n  ] Exertional SOB  [ y ]  Myer Peer  ]   Pedal Edema [n   ]    Palpitations Milo.Brash  ] Syncope  [ n ]   Presyncope [n   ]  General Review of Systems: [Y] = yes [  ]=no Constitional: recent weight change [n  ];  Wt loss over the last 3 months [ n  ] anorexia [  ]; fatigue Cove.Etienne  ]; nausea [  ]; night sweats [ n ]; fever [  ]; or chills [  ];          Dental: poor dentition[y  ]; Last Dentist visit:   Eye : blurred vision [  ]; diplopia [   ]; vision changes [  ];  Amaurosis fugax[  ]; Resp: cough [  ];  wheezing[ n ];  hemoptysis[n  ]; shortness of breath[  ]; paroxysmal nocturnal dyspnea[  ]; dyspnea on exertion[  ]; or orthopnea[  ];  GI:  gallstones[  ], vomiting[  ];  dysphagia[  ]; melena[  ];  hematochezia [  ]; heartburn[  ];   Hx of  Colonoscopy[  ]; GU: kidney stones [  ]; hematuria[  ];   dysuria [  ];  nocturia[  ];  history of     obstruction [  ]; urinary frequency [  ]             Skin: rash, swelling[  ];, hair loss[  ];  peripheral edema[  ];  or itching[  ]; Musculosketetal: myalgias[  ];  joint swelling[  ];  joint erythema[  ];  joint pain[  ];  back pain[  ];  Heme/Lymph: bruising[  ];  bleeding[  ];  anemia[  ];  Neuro: TIA[  ];  headaches[  ];  stroke[  ];  vertigo[  ];  seizures[  ];   paresthesias[  ];  difficulty walking[  ];  Psych:depression[y  ]; Kendell Bane  ];  Endocrine: diabetes[  ];  thyroid dysfunction[  ];  Immunizations: Flu up to date [  ]; Pneumococcal up to date [  ];  Other:  Physical Exam: BP 138/78 mmHg  Pulse 69  Temp(Src) 97.6 F (36.4 C) (Oral)  Resp 20  SpO2 98%  PHYSICAL EXAMINATION:  General appearance: alert, cooperative, appears stated age and no distress Neurologic: intact Heart: regular rate and rhythm, S1, S2 normal, no murmur, click, rub or gallop Lungs: clear to auscultation bilaterally Abdomen: soft,  non-tender; bowel sounds normal; no masses,  no organomegaly Extremities: extremities normal, atraumatic, no cyanosis or edema, Homans sign is negative, no sign of DVT and Scarring on the right foot from previous injury as a child, [place] in both lower extremity appear adequate for bypass Wound: Patient's right groin has slight amount of bruising related to cardiac cath but without palpable mass Patient has no carotid bruits, PT and DP pulses are present bilaterally   Diagnostic Studies & Laboratory data:     Recent Radiology Findings:   Dg Chest 2 View  01/05/2014   CLINICAL DATA:  Pre-admission for heart surgery  EXAM: CHEST  2 VIEW  COMPARISON:  12/08/2013  FINDINGS: Cardiomediastinal silhouette is stable. No acute infiltrate or pleural effusion. No pulmonary edema. Bony thorax is unremarkable.  IMPRESSION: No active cardiopulmonary disease.   Electronically Signed   By: Natasha MeadLiviu  Pop M.D.   On: 01/05/2014 17:10   Dg Chest 2 View  12/08/2013   CLINICAL DATA:  Left-sided chest pain.  Shortness of breath.  EXAM: CHEST  2 VIEW  COMPARISON:  None.  FINDINGS: The heart size and mediastinal contours are within normal limits. Both lungs are clear. The visualized skeletal structures are unremarkable.  IMPRESSION: No active cardiopulmonary disease.   Electronically Signed   By: Myles RosenthalJohn  Stahl M.D.   On: 12/08/2013 16:36   Cardiac Cath: Done at [place] shows total occlusion of LAD with collateral filling from the right patient is right dominant circulation 60-70% proximal right stenosis with collateral filling to the LAD ejection fraction is 40% with apical akinesis. The circumflex coronary artery is with luminal irregularities but no high-grade stenosis  Recent Lab Findings: Lab Results  Component Value Date   WBC 7.9 01/05/2014   HGB 15.7 01/05/2014   HCT 45.0 01/05/2014   PLT 177 01/05/2014   GLUCOSE 137* 01/05/2014   CHOL 225* 12/08/2013   TRIG 70 12/08/2013   HDL 41 12/08/2013   LDLCALC 170*  12/08/2013   ALT 14 01/05/2014   AST 13 01/05/2014   NA 140 01/05/2014   K 3.9 01/05/2014   CL 103 01/05/2014   CREATININE 0.64 01/05/2014   BUN 14 01/05/2014   CO2 22 01/05/2014   TSH 1.664 12/08/2013   INR 1.08 01/05/2014   HGBA1C 6.0* 01/05/2014      Assessment / Plan:   #1 two-vessel coronary artery disease with progressive angina and totally occluded LAD, depressed left ventricular ejection fraction proximal 40% #2 hypercholesterolemia #3 hypertension #4 long-term tobacco use, no history of diabetes    I've reviewed the patient's preoperative studies, including cardiac catheterization done 2 days ago. With the patient's progressive symptoms and progressive disease in the right with a totally occluded LAD I recommended to the patient that we proceed with revascularization of the left anterior descending coronary distribution and the right coronary. Risks and options of coronary artery bypass grafting were discussed with the patient and his wife in detail.   The goals risks and alternatives of the planned surgical procedure CABG have been discussed with the patient in detail. The risks of the procedure including death, infection, stroke, myocardial infarction, bleeding, blood transfusion have all been discussed specifically.  I have quoted Ethlyn GalleryScott [name] a 2% of perioperative mortality and a complication rate as high as 20 %. The patient's questions have been answered.Ethlyn GalleryScott [name] is willing  to proceed with the planned procedure.   Delight OvensEdward B Dezaray Shibuya MD      [address] [place], [id number] Office [phone]   Beeper [phone]  01/07/2014 9:05 AM

## 2014-01-07 NOTE — Progress Notes (Signed)
Patient ID: Ethlyn GalleryScott Gloss, male   DOB: 05/05/1960, 53 y.o.   MRN: [id number]   SICU Evening Rounds:   Hemodynamically stable  CI = 2.0 on milrinone and dop  Extubated and alert   Urine output good  CT output low  CBC    Component Value Date/Time   WBC 8.6 01/07/2014 1400   WBC 9.9 12/08/2013 1440   RBC 4.07* 01/07/2014 1400   RBC 5.82 12/08/2013 1440   HGB 11.6* 01/07/2014 1411   HGB 17.3 12/08/2013 1440   HCT 34.0* 01/07/2014 1411   HCT 52.0 12/08/2013 1440   PLT 97* 01/07/2014 1400   MCV 87.2 01/07/2014 1400   MCV 89.3 12/08/2013 1440   MCH 30.2 01/07/2014 1400   MCH 29.8 12/08/2013 1440   MCHC 34.6 01/07/2014 1400   MCHC 33.3 12/08/2013 1440   RDW 13.0 01/07/2014 1400     BMET    Component Value Date/Time   NA 137 01/07/2014 1411   K 4.1 01/07/2014 1411   CL 96 01/07/2014 1247   CO2 22 01/05/2014 1420   GLUCOSE 106* 01/07/2014 1411   BUN 8 01/07/2014 1247   CREATININE 0.60 01/07/2014 1247   CREATININE 0.76 12/08/2013 1427   CALCIUM 9.4 01/05/2014 1420   GFRNONAA >90 01/05/2014 1420   GFRNONAA >89 12/08/2013 1427   GFRAA >90 01/05/2014 1420   GFRAA >89 12/08/2013 1427     A/P:  Stable postop course. Continue current plans

## 2014-01-07 NOTE — Anesthesia Postprocedure Evaluation (Signed)
  Anesthesia Post-op Note  Patient: [name]  Procedure(s) Performed: Procedure(s) with comments: CORONARY ARTERY BYPASS GRAFTING (CABG) (N/A) - Coronary Artery Bypass Graft times two with internal mammary artery to Left Anterior Descending Coronary Artery and SVG to Distal Right Coronary Artery TRANSESOPHAGEAL ECHOCARDIOGRAM (TEE) (N/A)  Patient Location: PACU  Anesthesia Type:General  Level of Consciousness: Patient remains intubated per anesthesia plan  Airway and Oxygen Therapy: Patient remains intubated per anesthesia plan  Post-op Pain: none  Post-op Assessment: Post-op Vital signs reviewed  Post-op Vital Signs: Reviewed  Last Vitals:  Filed Vitals:   01/07/14 1605  BP: 90/54  Pulse:   Temp:   Resp: 11    Complications: No apparent anesthesia complications

## 2014-01-07 NOTE — Transfer of Care (Signed)
Immediate Anesthesia Transfer of Care Note  Patient: [name]  Procedure(s) Performed: Procedure(s) with comments: CORONARY ARTERY BYPASS GRAFTING (CABG) (N/A) - Coronary Artery Bypass Graft times two with internal mammary artery to Left Anterior Descending Coronary Artery and SVG to Distal Right Coronary Artery TRANSESOPHAGEAL ECHOCARDIOGRAM (TEE) (N/A)  Patient Location: SICU  Anesthesia Type:General  Level of Consciousness: sedated and Patient remains intubated per anesthesia plan  Airway & Oxygen Therapy: Patient remains intubated per anesthesia plan and Patient placed on Ventilator (see vital sign flow sheet for setting)  Post-op Assessment: Report given to PACU RN and Post -op Vital signs reviewed and stable  Post vital signs: Reviewed and stable  Complications: No apparent anesthesia complications

## 2014-01-07 NOTE — Progress Notes (Signed)
  Echocardiogram Echocardiogram Transesophageal has been performed.  Arvil ChacoFoster, Marceline Napierala 01/07/2014, 10:23 AM

## 2014-01-07 NOTE — Brief Op Note (Addendum)
      [address]       [place] [id number]             [phone]      01/07/2014  12:12 PM  PATIENT:  Ethlyn GalleryScott Dome  53 y.o. male  PRE-OPERATIVE DIAGNOSIS:  CAD  POST-OPERATIVE DIAGNOSIS:  CAD  PROCEDURE:  TRANSESOPHAGEAL ECHOCARDIOGRAM (TEE), MEDIAN STERNOTOMY for CORONARY ARTERY BYPASS GRAFTING (CABG) x 2 (LIMA to LAD, SVG to RCA) with EVH from right thigh  SURGEON:  Surgeon(s) and Role:    * Delight OvensEdward B Donaciano Range, MD - Primary  PHYSICIAN ASSISTANT: Doree Fudgeonielle Zimmerman PA-C  ANESTHESIA:   general  EBL:  Total I/O In: -  Out: 275 [Urine:275]   DRAINS: Chest tubes placed in the mediastinal and pleural spaces   COUNTS CORRECT:  YES  DICTATION: .Dragon Dictation  PLAN OF CARE: Admit to inpatient   PATIENT DISPOSITION:  ICU - intubated and hemodynamically stable.   Delay start of Pharmacological VTE agent (>24hrs) due to surgical blood loss or risk of bleeding: yes  BASELINE WEIGHT: 70 kg

## 2014-01-07 NOTE — Anesthesia Procedure Notes (Signed)
Procedures

## 2014-01-07 NOTE — Anesthesia Preprocedure Evaluation (Addendum)
Anesthesia Evaluation  Patient identified by MRN, date of birth, ID band Patient awake    Reviewed: Allergy & Precautions, H&P , NPO status , Patient's Chart, lab work & pertinent test results  Airway Mallampati: III  TM Distance: >3 FB Neck ROM: Full    Dental  (+) Poor Dentition   Pulmonary Current Smoker,          Cardiovascular hypertension, + angina + CAD and + Past MI     Neuro/Psych Anxiety negative neurological ROS     GI/Hepatic negative GI ROS, Neg liver ROS,   Endo/Other  negative endocrine ROS  Renal/GU negative Renal ROS     Musculoskeletal negative musculoskeletal ROS (+)   Abdominal   Peds  Hematology   Anesthesia Other Findings   Reproductive/Obstetrics                            Anesthesia Physical Anesthesia Plan  ASA: IV  Anesthesia Plan: General   Post-op Pain Management:    Induction: Intravenous  Airway Management Planned: Oral ETT  Additional Equipment: CVP, Arterial line, PA Cath, TEE and Ultrasound Guidance Line Placement  Intra-op Plan:   Post-operative Plan: Post-operative intubation/ventilation  Informed Consent: I have reviewed the patients History and Physical, chart, labs and discussed the procedure including the risks, benefits and alternatives for the proposed anesthesia with the patient or authorized representative who has indicated his/her understanding and acceptance.   Dental advisory given  Plan Discussed with: CRNA and Surgeon  Anesthesia Plan Comments:         Anesthesia Quick Evaluation

## 2014-01-07 NOTE — Procedures (Signed)
Extubation Procedure Note  Patient Details:   Name: [name] DOB: 04/24/1960 MRN: [id number]   Airway Documentation:  Airway 8 mm (Active)  Secured at (cm) 22 cm 01/07/2014  4:05 PM  Measured From Lips 01/07/2014  4:05 PM  Secured Location Right 01/07/2014  4:05 PM  Secured By Pink Tape 01/07/2014  4:05 PM  Site Condition Dry 01/07/2014  4:05 PM    Evaluation  O2 sats: stable throughout Complications: No apparent complications Patient did tolerate procedure well. Bilateral Breath Sounds: Clear Suctioning: Airway Yes   Patient extubated to 2L nasal cannula.  Positive cuff leak noted.  No evidence of stridor.  Sats currently 95%.  Vitals are stable.  Patient able to speak post extubation.  Incentive spirometry performed x5 with achieved goal of 600   Durwin GlazeBrown, Carmilla Granville N 01/07/2014, 5:27 PM

## 2014-01-07 NOTE — Progress Notes (Signed)
Began weaning trail of SIMV/PS, respiratory rate of 4 and FIO2 of 40%.

## 2014-01-08 ENCOUNTER — Inpatient Hospital Stay (HOSPITAL_COMMUNITY): Payer: 59

## 2014-01-08 LAB — POCT I-STAT, CHEM 8
BUN: 15 mg/dL (ref 6–23)
Calcium, Ion: 1.21 mmol/L (ref 1.12–1.23)
Chloride: 98 mEq/L (ref 96–112)
Creatinine, Ser: 1 mg/dL (ref 0.50–1.35)
Glucose, Bld: 134 mg/dL — ABNORMAL HIGH (ref 70–99)
HCT: 39 % (ref 39.0–52.0)
Hemoglobin: 13.3 g/dL (ref 13.0–17.0)
Potassium: 4.3 mEq/L (ref 3.7–5.3)
Sodium: 135 mEq/L — ABNORMAL LOW (ref 137–147)
TCO2: 23 mmol/L (ref 0–100)

## 2014-01-08 LAB — CBC
HEMATOCRIT: 35.2 % — AB (ref 39.0–52.0)
HEMATOCRIT: 37.5 % — AB (ref 39.0–52.0)
Hemoglobin: 12.4 g/dL — ABNORMAL LOW (ref 13.0–17.0)
Hemoglobin: 12.7 g/dL — ABNORMAL LOW (ref 13.0–17.0)
MCH: 30.3 pg (ref 26.0–34.0)
MCH: 29.6 pg (ref 26.0–34.0)
MCHC: 35.2 g/dL (ref 30.0–36.0)
MCHC: 33.9 g/dL (ref 30.0–36.0)
MCV: 86.1 fL (ref 78.0–100.0)
MCV: 87.4 fL (ref 78.0–100.0)
PLATELETS: 112 10*3/uL — AB (ref 150–400)
Platelets: 124 10*3/uL — ABNORMAL LOW (ref 150–400)
RBC: 4.09 MIL/uL — AB (ref 4.22–5.81)
RBC: 4.29 MIL/uL (ref 4.22–5.81)
RDW: 13.3 % (ref 11.5–15.5)
RDW: 13.4 % (ref 11.5–15.5)
WBC: 9.5 10*3/uL (ref 4.0–10.5)
WBC: 12.9 10*3/uL — AB (ref 4.0–10.5)

## 2014-01-08 LAB — GLUCOSE, CAPILLARY
Glucose-Capillary: 120 mg/dL — ABNORMAL HIGH (ref 70–99)
Glucose-Capillary: 108 mg/dL — ABNORMAL HIGH (ref 70–99)
Glucose-Capillary: 143 mg/dL — ABNORMAL HIGH (ref 70–99)
Glucose-Capillary: 120 mg/dL — ABNORMAL HIGH (ref 70–99)

## 2014-01-08 LAB — BASIC METABOLIC PANEL
ANION GAP: 11 (ref 5–15)
BUN: 10 mg/dL (ref 6–23)
CHLORIDE: 102 meq/L (ref 96–112)
CO2: 22 meq/L (ref 19–32)
CREATININE: 0.7 mg/dL (ref 0.50–1.35)
Calcium: 7.9 mg/dL — ABNORMAL LOW (ref 8.4–10.5)
GFR calc non Af Amer: >90 mL/min (ref >90)
Glucose, Bld: 122 mg/dL — ABNORMAL HIGH (ref 70–99)
Potassium: 4.3 mEq/L (ref 3.7–5.3)
Sodium: 135 mEq/L — ABNORMAL LOW (ref 137–147)

## 2014-01-08 LAB — CREATININE, SERUM
Creatinine, Ser: 0.84 mg/dL (ref 0.50–1.35)
GFR calc non Af Amer: >90 mL/min (ref >90)

## 2014-01-08 LAB — MAGNESIUM
Magnesium: 2.2 mg/dL (ref 1.5–2.5)
Magnesium: 2.3 mg/dL (ref 1.5–2.5)

## 2014-01-08 MED ORDER — ENOXAPARIN SODIUM 30 MG/0.3ML ~~LOC~~ SOLN
30.0000 mg | SUBCUTANEOUS | Status: DC
  Administered 2014-01-08 – 2014-01-09 (×2): 30 mg via SUBCUTANEOUS
  Filled 2014-01-08 (×4): qty 0.3

## 2014-01-08 MED ORDER — INSULIN ASPART 100 UNIT/ML ~~LOC~~ SOLN
0.0000 [IU] | SUBCUTANEOUS | Status: DC
  Administered 2014-01-08 – 2014-01-09 (×4): 2 [IU] via SUBCUTANEOUS

## 2014-01-08 MED FILL — Heparin Sodium (Porcine) Inj 1000 Unit/ML: INTRAMUSCULAR | Qty: 30 | Status: AC

## 2014-01-08 MED FILL — Potassium Chloride Inj 2 mEq/ML: INTRAVENOUS | Qty: 40 | Status: AC

## 2014-01-08 MED FILL — Magnesium Sulfate Inj 50%: INTRAMUSCULAR | Qty: 10 | Status: AC

## 2014-01-08 NOTE — Progress Notes (Signed)
POD # 1 CABG x 2  Up in chair  BP 92/55 mmHg  Pulse 67  Temp(Src) 97.7 F (36.5 C) (Oral)  Resp 14  Ht 5\' 4"  (1.626 m)  Wt 156 lb 8.4 oz (71 kg)  BMI 26.85 kg/m2  SpO2 91%   Intake/Output Summary (Last 24 hours) at 01/08/14 1736 Last data filed at 01/08/14 1500  Gross per 24 hour  Intake 1797.6 ml  Output   1315 ml  Net  482.6 ml   K= 4.3, Hct= 39  Continue current care

## 2014-01-08 NOTE — Progress Notes (Signed)
Patient ID: Marisa Wolven, male   DOB: 08/14/1960, [address] Gallery53 y.o.   MRN: [id number] TCTS DAILY ICU PROGRESS NOTE                   [address]            [place] [id number]          [phone]   1 Day Post-Op Procedure(s) (LRB): CORONARY ARTERY BYPASS GRAFTING (CABG) (N/A) TRANSESOPHAGEAL ECHOCARDIOGRAM (TEE) (N/A)  Total Length of Stay:  LOS: 1 day   Subjective: Extubated awake and alert, good pain contol  Objective: Vital signs in last 24 hours: Temp:  [97.5 F (36.4 C)-99.1 F (37.3 C)] 98.6 F (37 C) (12/10 0645) Pulse Rate:  [57-87] 87 (12/10 0645) Cardiac Rhythm:  [-] Normal sinus rhythm (12/10 0400) Resp:  [11-29] 14 (12/10 0645) BP: (81-151)/(51-88) 99/59 mmHg (12/10 0645) SpO2:  [90 %-100 %] 98 % (12/10 0645) Arterial Line BP: (86-139)/(51-76) 135/66 mmHg (12/10 0645) FiO2 (%):  [40 %-50 %] 40 % (12/09 1630) Weight:  [156 lb 8.4 oz (71 kg)] 156 lb 8.4 oz (71 kg) (12/10 0500)  Filed Weights   01/08/14 0500  Weight: 156 lb 8.4 oz (71 kg)    Weight change:    Hemodynamic parameters for last 24 hours: PAP: (17-54)/(10-32) 26/18 mmHg CO:  [2.8 L/min-4.6 L/min] 4.6 L/min CI:  [1.6 L/min/m2-2.6 L/min/m2] 2.6 L/min/m2  Intake/Output from previous day: 12/09 0701 - 12/10 0700 In: 4762.5 [P.O.:360; I.V.:3026.5; Blood:526; IV Piggyback:850] Out: 4480 [Urine:3290; Blood:900; Chest Tube:290]  Intake/Output this shift:    Current Meds: Scheduled Meds: . acetaminophen  1,000 mg Oral 4 times per day   Or  . acetaminophen (TYLENOL) oral liquid 160 mg/5 mL  1,000 mg Per Tube 4 times per day  . antiseptic oral rinse  7 mL Mouth Rinse q12n4p  . aspirin EC  325 mg Oral Daily   Or  . aspirin  324 mg Per Tube Daily  . bisacodyl  10 mg Oral Daily   Or  . bisacodyl  10 mg Rectal Daily  . cefUROXime (ZINACEF)  IV  1.5 g Intravenous Q12H  . chlorhexidine  15 mL Mouth Rinse BID  . docusate sodium  200 mg Oral Daily  . famotidine (PEPCID) IV  20 mg Intravenous  Q12H  . Influenza vac split quadrivalent PF  0.5 mL Intramuscular Tomorrow-1000  . insulin aspart  2-6 Units Subcutaneous 6 times per day  . insulin regular  0-10 Units Intravenous TID WC  . metoprolol tartrate  12.5 mg Oral BID   Or  . metoprolol tartrate  12.5 mg Per Tube BID  . [START ON 01/09/2014] pantoprazole  40 mg Oral Daily  . pneumococcal 23 valent vaccine  0.5 mL Intramuscular Tomorrow-1000  . simvastatin  20 mg Oral q1800  . sodium chloride  3 mL Intravenous Q12H   Continuous Infusions: . sodium chloride 20 mL/hr (01/07/14 1400)  . sodium chloride    . sodium chloride 20 mL (01/07/14 1400)  . dexmedetomidine Stopped (01/07/14 1550)  . DOPamine 3 mcg/kg/min (01/07/14 1800)  . insulin (NOVOLIN-R) infusion Stopped (01/07/14 1730)  . lactated ringers 20 mL/hr (01/07/14 1401)  . milrinone 0.3 mcg/kg/min (01/07/14 2300)  . nitroGLYCERIN Stopped (01/07/14 1401)  . phenylephrine (NEO-SYNEPHRINE) Adult infusion Stopped (01/08/14 0100)   PRN Meds:.albumin human, metoprolol, morphine injection, ondansetron (ZOFRAN) IV, oxyCODONE, sodium chloride, traMADol  General appearance: alert, cooperative and no distress Neurologic: intact Heart: regular rate and rhythm,  S1, S2 normal, no murmur, click, rub or gallop Lungs: diminished breath sounds bibasilar Abdomen: soft, non-tender; bowel sounds normal; no masses,  no organomegaly Extremities: extremities normal, atraumatic, no cyanosis or edema and Homans sign is negative, no sign of DVT Wound: sternum intact  Lab Results: CBC: Recent Labs  01/07/14 2000 01/08/14 0400  WBC 13.4* 9.5  HGB 13.5 12.4*  HCT 39.0 35.2*  PLT 129* 112*   BMET:  Recent Labs  01/05/14 1420  01/07/14 1956 01/07/14 2000 01/08/14 0400  NA 140  < > 140  --  135*  K 3.9  < > 4.6  --  4.3  CL 103  < > 107  --  102  CO2 22  --   --   --  22  GLUCOSE 137*  < > 143*  --  122*  BUN 14  < > 9  --  10  CREATININE 0.64  < > 0.70 0.73 0.70  CALCIUM 9.4   --   --   --  7.9*  < > = values in this interval not displayed.  PT/INR:  Recent Labs  01/07/14 1400  LABPROT 17.7*  INR 1.44   Radiology: Dg Chest Port 1 View  01/07/2014   CLINICAL DATA:  53 year old male status post CABG. Initial encounter.  EXAM: PORTABLE CHEST - 1 VIEW  COMPARISON:  01/05/2014 and earlier.  FINDINGS: Portable AP semi upright view at 1449 hrs.  Endotracheal tube tip just below the clavicles. Enteric tube courses to the abdomen, side hole to level of the gastric cardia. Left chest tube and mediastinal tubes in place.  Right IJ approach Swan-Ganz catheter, tip at the level of the pulmonary outflow tract.  No pneumothorax or pulmonary edema. Stable cardiac size and mediastinal contours. Mild atelectasis. No pleural effusion.  IMPRESSION: 1. Lines and tubes appear appropriately placed. Swan-Ganz catheter tip at the pulmonary trunk. 2. Mild atelectasis.  No pneumothorax or pulmonary edema.   Electronically Signed   By: Augusto GambleLee  Hall M.D.   On: 01/07/2014 15:14     Assessment/Plan: S/P Procedure(s) (LRB): CORONARY ARTERY BYPASS GRAFTING (CABG) (N/A) TRANSESOPHAGEAL ECHOCARDIOGRAM (TEE) (N/A) Mobilize Diuresis d/c tubes/lines Continue foley due to diuresing patient, strict I&O, patient in ICU and urinary output monitoring See progression orders Expected Acute  Blood - loss Anemia Wean off milrinone and dopamine today    Angelus Hoopes B 01/08/2014 8:11 AM

## 2014-01-08 NOTE — Plan of Care (Signed)
Problem: Phase II - Intermediate Post-Op Goal: Maintain Hemodynamic Stability Outcome: Completed/Met Date Met:  01/08/14

## 2014-01-08 NOTE — Progress Notes (Signed)
UR Completed.  Kanai Hilger Jane [phone] 01/08/2014  

## 2014-01-09 ENCOUNTER — Inpatient Hospital Stay (HOSPITAL_COMMUNITY): Payer: 59

## 2014-01-09 ENCOUNTER — Encounter (HOSPITAL_COMMUNITY): Payer: Self-pay | Admitting: Cardiothoracic Surgery

## 2014-01-09 LAB — BASIC METABOLIC PANEL
Anion gap: 12 (ref 5–15)
BUN: 20 mg/dL (ref 6–23)
CO2: 26 mEq/L (ref 19–32)
Calcium: 8.7 mg/dL (ref 8.4–10.5)
Chloride: 97 mEq/L (ref 96–112)
Creatinine, Ser: 0.76 mg/dL (ref 0.50–1.35)
GFR calc Af Amer: >90 mL/min (ref >90)
GFR calc non Af Amer: >90 mL/min (ref >90)
Glucose, Bld: 108 mg/dL — ABNORMAL HIGH (ref 70–99)
Potassium: 4.5 mEq/L (ref 3.7–5.3)
Sodium: 135 mEq/L — ABNORMAL LOW (ref 137–147)

## 2014-01-09 LAB — CBC
HCT: 36.9 % — ABNORMAL LOW (ref 39.0–52.0)
Hemoglobin: 12.7 g/dL — ABNORMAL LOW (ref 13.0–17.0)
MCH: 30.5 pg (ref 26.0–34.0)
MCHC: 34.4 g/dL (ref 30.0–36.0)
MCV: 88.7 fL (ref 78.0–100.0)
Platelets: 117 10*3/uL — ABNORMAL LOW (ref 150–400)
RBC: 4.16 MIL/uL — ABNORMAL LOW (ref 4.22–5.81)
RDW: 13.4 % (ref 11.5–15.5)
WBC: 12.8 10*3/uL — ABNORMAL HIGH (ref 4.0–10.5)

## 2014-01-09 LAB — GLUCOSE, CAPILLARY
Glucose-Capillary: 116 mg/dL — ABNORMAL HIGH (ref 70–99)
Glucose-Capillary: 130 mg/dL — ABNORMAL HIGH (ref 70–99)
Glucose-Capillary: 123 mg/dL — ABNORMAL HIGH (ref 70–99)

## 2014-01-09 MED ORDER — FUROSEMIDE 10 MG/ML IJ SOLN
INTRAMUSCULAR | Status: AC
  Filled 2014-01-09: qty 2

## 2014-01-09 MED ORDER — FUROSEMIDE 10 MG/ML IJ SOLN
20.0000 mg | Freq: Once | INTRAMUSCULAR | Status: AC
  Administered 2014-01-09: 20 mg via INTRAVENOUS
  Filled 2014-01-09: qty 2

## 2014-01-09 NOTE — Plan of Care (Signed)
Problem: Phase II - Intermediate Post-Op Goal: Wean to Extubate Outcome: Completed/Met Date Met:  01/09/14 Pt continues on 2L via [place] Goal: CBGs/Blood Glucose per SCIP Criteria Outcome: Progressing CBG every 4 hours Goal: Pain controlled with appropriate interventions Outcome: Progressing Goal: Advance Diet Tolerating clear liquid diet    Goal: Activity Progressed Outcome: Progressing Pt ambulated in hallway  Goal: Patient advanced to Phase III: Barriers addressed Outcome: Progressing

## 2014-01-09 NOTE — Progress Notes (Signed)
CARDIAC REHAB PHASE I   PRE:  Rate/Rhythm: 86 SR  BP:  Supine:   Sitting: 120/78  Standing:    SaO2: 91 2L  MODE:  Ambulation: 350 ft   POST:  Rate/Rhythm: 99 SR  BP:  Supine:   Sitting: 131/92  Standing:    SaO2: 91 2L 1500-1535 On arrival pt getting ready to walk with RN. I took over. Assisted X 1 used w/c for pt to push and O2 2L. Gait steady with w/c. Pt is DOE and tires with walking. He took one standing rest stop in hall. O2 sat after walk 91% on 2L. Pt back to recliner after walk with call light in reach.  Melina CopaLisa Heinz Eckert RN 01/09/2014 3:31 PM

## 2014-01-09 NOTE — Progress Notes (Signed)
2 Days Post-Op Procedure(s) (LRB): CORONARY ARTERY BYPASS GRAFTING (CABG) (N/A) TRANSESOPHAGEAL ECHOCARDIOGRAM (TEE) (N/A) Subjective: OOB to chair, NSR Walked in hall CXR clear- off drips  Objective: Vital signs in last 24 hours: Temp:  [97.3 F (36.3 C)-98.6 F (37 C)] 97.3 F (36.3 C) (12/11 0805) Pulse Rate:  [63-87] 71 (12/11 0700) Cardiac Rhythm:  [-] Normal sinus rhythm (12/11 0600) Resp:  [11-30] 13 (12/11 0700) BP: (85-140)/(55-91) 125/71 mmHg (12/11 0700) SpO2:  [89 %-100 %] 95 % (12/11 0700) Arterial Line BP: (132-142)/(67-75) 142/75 mmHg (12/10 1100) Weight:  [157 lb 3 oz (71.3 kg)] 157 lb 3 oz (71.3 kg) (12/11 0500)  Hemodynamic parameters for last 24 hours: PAP: (31)/(17) 31/17 mmHg  Intake/Output from previous day: 12/10 0701 - 12/11 0700 In: 919.5 [P.O.:300; I.V.:519.5; IV Piggyback:100] Out: 625 [Urine:605; Chest Tube:20] Intake/Output this shift:    Lungs clear  Lab Results:  Recent Labs  01/08/14 1600 01/08/14 1612 01/09/14 0400  WBC 12.9*  --  12.8*  HGB 12.7* 13.3 12.7*  HCT 37.5* 39.0 36.9*  PLT 124*  --  117*   BMET:  Recent Labs  01/08/14 0400  01/08/14 1612 01/09/14 0400  NA 135*  --  135* 135*  K 4.3  --  4.3 4.5  CL 102  --  98 97  CO2 22  --   --  26  GLUCOSE 122*  --  134* 108*  BUN 10  --  15 20  CREATININE 0.70  < > 1.00 0.76  CALCIUM 7.9*  --   --  8.7  < > = values in this interval not displayed.  PT/INR:  Recent Labs  01/07/14 1400  LABPROT 17.7*  INR 1.44   ABG    Component Value Date/Time   PHART 7.308* 01/07/2014 1824   HCO3 22.1 01/07/2014 1824   TCO2 23 01/08/2014 1612   ACIDBASEDEF 4.0* 01/07/2014 1824   O2SAT 96.0 01/07/2014 1824   CBG (last 3)   Recent Labs  01/08/14 2037 01/09/14 0025 01/09/14 0411  GLUCAP 120* 130* 123*    Assessment/Plan: S/P Procedure(s) (LRB): CORONARY ARTERY BYPASS GRAFTING (CABG) (N/A) TRANSESOPHAGEAL ECHOCARDIOGRAM (TEE) (N/A) Plan for transfer to step-down:  see transfer orders Start low dose lisinopril prior to DC  LOS: 2 days    [name],[name] 01/09/2014

## 2014-01-09 NOTE — Discharge Summary (Signed)
Physician Discharge Summary       [address]       [place] [id number]             [phone]    [name] MRN: [id number] DOB/AGE: 55/07/1960 55 y.o.  Admit date: 01/07/2014 Discharge date: 01/12/2014    Admission Diagnoses: 1. Coronary artery disease 2. History of hypertension 3. History of tobacco abuse  Discharge Diagnoses:  1. Coronary artery disease 2. History of hypertension 3. History of tobacco abuse 4. Pre diabetes (HGA1C 6) 5. ABL anemia 6. Thrombocytopenia   Procedure (s):TRANSESOPHAGEAL ECHOCARDIOGRAM (TEE), MEDIAN STERNOTOMY for CORONARY ARTERY BYPASS GRAFTING (CABG) x 2 (LIMA to LAD, SVG to RCA) with EVH from right thigh by Dr. Tyrone SageGerhardt on 01/07/2014.   History of Presenting Illness This is a 53 y.o. male is seen in the office for consideration of coronary artery bypass graft. The patient has approximately 2 year history of on and off episodes of shortness of breath and chest discomfort radiating to the neck brought on with activity. Recently, he had a more severe episode associated with more anxiety. At the urging of his family, he went to urgent care in [place] was then referred to the [facility]. He was then referred as an outpatient to Dr. Park BreedKahn. An outpatient stress test, echocardiogram, and cardiac CT scan were performed. 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 Patient had ejection fraction of 40% with diffuse hypokinesis large apical septal and inferior fixed defect on myocardial perfusion study.  The patient has definite exertionally-related anginal symptoms, present for at least 2 years but progressively becoming more severe. 2-3 years ago he remembers a specific episode while loading a car onto a truck where he had very severe prolonged chest discomfort but did not seek medical  attention at that time.  The patient denies diabetes he had been a long-term smoker for more than 30 years currently smokes occasional cigar.  Dr. Tyrone SageGerhardt discussed the need for coronary artery bypass grafting surgery. Potential risks, benefits, and complications were discussed with the patient and he agreed to proceed with surgery. Preoperative carotid duplex US showed no significant internal carotid artery stenosis. He underwent CABG x 3 on 01/07/2014.    Brief Hospital Course:  Postoperatively, the patient has done well. His chest tubes and hemodynamic monitoring lines were removed on postop day 1 and he was able to transfer out of the ICU on postop day 2.  He has had a mild blood loss anemia, but has not required transfusion.  He is maintaining sinus rhythm and blood pressures have been controlled.  Incisions are healing well.  The patient is ambulating in the halls and is tolerating a regular diet.  We anticipate discharge home on the morning on 01/22/2014 provided no acute changes occur.   Latest Vital Signs: Blood pressure 113/72, pulse 72, temperature 98.1 F (36.7 C), temperature source Oral, resp. rate 20, height 5\' 4"  (1.626 m), weight 151 lb 7.3 oz (68.7 kg), SpO2 97 %.  Physical Exam: General appearance: alert, cooperative and no distress Neurologic: intact Heart: regular rate and rhythm, S1, S2 normal, no murmur, click, rub or gallop Lungs: diminished breath sounds bibasilar Abdomen: soft, non-tender; bowel sounds normal; no masses, no organomegaly Extremities: extremities normal, atraumatic, no cyanosis or edema and Homans sign is negative, no sign of DVT Wound: sternum intact   Discharge Condition:Stable  Recent laboratory studies:  Lab Results  Component Value Date   WBC 9.1 01/10/2014   HGB 10.4* 01/10/2014   HCT 30.6* 01/10/2014   MCV 86.0 01/10/2014   PLT 101* 01/10/2014   Lab Results  Component Value Date   NA 135* 01/10/2014   K 4.0 01/10/2014   CL  96 01/10/2014   CO2 26 01/10/2014   CREATININE 0.66 01/10/2014   GLUCOSE 111* 01/10/2014    Diagnostic Studies: Dg Chest Port 1 View  01/09/2014   CLINICAL DATA:  Coronary artery disease.  CABG.  EXAM: PORTABLE CHEST - 1 VIEW  COMPARISON:  01/08/2014  FINDINGS: Swan-Ganz catheter has been removed. Left chest tube remains. No pneumothorax. New small left effusion. Pulmonary vascularity is normal. Right lung is clear.  IMPRESSION: New small left effusion.  No pneumothorax.   Electronically Signed   By: Geanie CooleyJim  Maxwell M.D.   On: 01/09/2014 08:08   Discharge Medications:   Medication List    STOP taking these medications        aspirin 81 MG tablet  Replaced by:  aspirin 325 MG EC tablet     carvedilol 6.25 MG tablet  Commonly known as:  COREG      TAKE these medications        acetaminophen 500 MG tablet  Commonly known as:  TYLENOL  Take 1,000 mg by mouth every 6 (six) hours as needed.     ALPRAZolam 0.25 MG tablet  Commonly known as:  XANAX  Take 1 tablet (0.25 mg total) by mouth 2 (two) times daily as needed for anxiety.     aspirin 325 MG EC tablet  Take 1 tablet (325 mg total) by mouth daily.     lisinopril 5 MG tablet  Commonly known as:  PRINIVIL,ZESTRIL  Take 1 tablet (5 mg total) by mouth daily.     metoprolol tartrate 25 MG tablet  Commonly known as:  LOPRESSOR  Take 0.5 tablets (12.5 mg total) by mouth 2 (two) times daily.     oxyCODONE 5 MG immediate release tablet  Commonly known as:  Oxy IR/ROXICODONE  Take 1-2 tablets (5-10 mg total) by mouth every 4 (four) hours as needed for severe pain.     simvastatin 20 MG tablet  Commonly known as:  ZOCOR  Take 1 tablet (20 mg total) by mouth daily at 6 PM.         The patient has been discharged on:   1.Beta Blocker:  Yes [ x  ]                              No   [   ]                              If No, reason:  2.Ace Inhibitor/ARB: Yes [ x  ]                                     No  [    ]                                      If No, reason:  3.Statin:   Yes [ x  ]  No  [   ]                  If No, reason:  4.Ecasa:  Yes  [ x  ]                  No   [   ]                  If No, reason:     Follow Up Appointments: Follow-up Information    Follow up with Laurier Nancy, MD.   Specialty:  Cardiology   Why:  Call for a follow up appointment for 1 week   Contact information:   [address] [place] [address] [phone]       Follow up with Delight Ovens, MD On 02/19/2014.   Specialty:  Cardiothoracic Surgery   Why:  PA/LAT CXR to be taken (at [facility] which is in the same building as Dr. Dennie Maizes office) on 02/24/2014 at 9:00 am;Appointment with Dr. Tyrone Sage is at 10:00 am   Contact information:   [address] [place] [address] [phone]       Follow up with Medical Doctor.   Why:  Call for a follow up appointment regarding further surveillance of HGA1C 6.0      Signed: ZIMMERMAN,DONIELLE MPA-C 01/12/2014, 7:31 AM

## 2014-01-10 ENCOUNTER — Inpatient Hospital Stay (HOSPITAL_COMMUNITY): Payer: 59

## 2014-01-10 LAB — GLUCOSE, CAPILLARY: Glucose-Capillary: 135 mg/dL — ABNORMAL HIGH (ref 70–99)

## 2014-01-10 LAB — CBC
HCT: 30.6 % — ABNORMAL LOW (ref 39.0–52.0)
Hemoglobin: 10.4 g/dL — ABNORMAL LOW (ref 13.0–17.0)
MCH: 29.2 pg (ref 26.0–34.0)
MCHC: 34 g/dL (ref 30.0–36.0)
MCV: 86 fL (ref 78.0–100.0)
Platelets: 101 10*3/uL — ABNORMAL LOW (ref 150–400)
RBC: 3.56 MIL/uL — ABNORMAL LOW (ref 4.22–5.81)
RDW: 13.2 % (ref 11.5–15.5)
WBC: 9.1 10*3/uL (ref 4.0–10.5)

## 2014-01-10 LAB — BASIC METABOLIC PANEL
Anion gap: 13 (ref 5–15)
BUN: 13 mg/dL (ref 6–23)
CO2: 26 mEq/L (ref 19–32)
Calcium: 8.6 mg/dL (ref 8.4–10.5)
Chloride: 96 mEq/L (ref 96–112)
Creatinine, Ser: 0.66 mg/dL (ref 0.50–1.35)
GFR calc Af Amer: >90 mL/min (ref >90)
GFR calc non Af Amer: >90 mL/min (ref >90)
Glucose, Bld: 111 mg/dL — ABNORMAL HIGH (ref 70–99)
Potassium: 4 mEq/L (ref 3.7–5.3)
Sodium: 135 mEq/L — ABNORMAL LOW (ref 137–147)

## 2014-01-10 MED ORDER — LISINOPRIL 5 MG PO TABS
5.0000 mg | ORAL_TABLET | Freq: Every day | ORAL | Status: DC
  Administered 2014-01-10 – 2014-01-12 (×3): 5 mg via ORAL
  Filled 2014-01-10 (×3): qty 1

## 2014-01-10 MED ORDER — ENOXAPARIN SODIUM 40 MG/0.4ML ~~LOC~~ SOLN
40.0000 mg | SUBCUTANEOUS | Status: DC
  Administered 2014-01-10 – 2014-01-11 (×2): 40 mg via SUBCUTANEOUS
  Filled 2014-01-10 (×3): qty 0.4

## 2014-01-10 NOTE — Progress Notes (Signed)
Pt ambulated 450 ft with NT pushing wheelchair. Pt with no complaints.  Pt assisted to bed for central line removal. Will continue to monitor pt closely.

## 2014-01-10 NOTE — Progress Notes (Signed)
Pt weaned to RA from 2L. O2 level ranging from 96-100% on RA. Will continue to monitor pt closely.

## 2014-01-10 NOTE — Progress Notes (Signed)
Pt ambulated with family in hall 300 feet. He denied any complaints.

## 2014-01-10 NOTE — Progress Notes (Addendum)
       [address]       [place] [id number]             [phone]          3 Days Post-Op Procedure(s) (LRB): CORONARY ARTERY BYPASS GRAFTING (CABG) (N/A) TRANSESOPHAGEAL ECHOCARDIOGRAM (TEE) (N/A)  Subjective: Feels well, no complaints.   Objective: Vital signs in last 24 hours: Patient Vitals for the past 24 hrs:  BP Temp Temp src Pulse Resp SpO2 Weight  01/10/14 0625 - - - - - - 155 lb 6.8 oz (70.5 kg)  01/10/14 0600 129/84 mmHg 97.8 F (36.6 C) Oral 84 20 97 % -  01/09/14 2151 117/63 mmHg 97.8 F (36.6 C) Oral 78 20 95 % -  01/09/14 1440 120/78 mmHg 97.5 F (36.4 C) Oral 83 (!) 21 95 % -  01/09/14 1053 (!) 141/64 mmHg 97.6 F (36.4 C) - 88 20 96 % -  01/09/14 1000 (!) 141/83 mmHg - - 91 20 92 % -  01/09/14 0900 (!) 150/84 mmHg - - 82 (!) 24 94 % -   Current Weight  01/10/14 155 lb 6.8 oz (70.5 kg)  BASELINE WEIGHT: 70 kg   Intake/Output from previous day: 12/11 0701 - 12/12 0700 In: 313 [P.O.:240; I.V.:23; IV Piggyback:50] Out: 980 [Urine:980]    PHYSICAL EXAM:  Heart: RRR Lungs: Clear Wound: Clean and dry Extremities: Minimal LE edema    Lab Results: CBC: Recent Labs  01/09/14 0400 01/10/14 0310  WBC 12.8* 9.1  HGB 12.7* 10.4*  HCT 36.9* 30.6*  PLT 117* 101*   BMET:  Recent Labs  01/09/14 0400 01/10/14 0310  NA 135* 135*  K 4.5 4.0  CL 97 96  CO2 26 26  GLUCOSE 108* 111*  BUN 20 13  CREATININE 0.76 0.66  CALCIUM 8.7 8.6    PT/INR:  Recent Labs  01/07/14 1400  LABPROT 17.7*  INR 1.44   CXR: FINDINGS: Left chest tube has been removed. No pneumothorax identified. Decreased atelectasis seen in left lung base. Probable tiny right pleural effusion noted. Mild cardiomegaly stable.  IMPRESSION: Decreased left lower lobe atelectasis and probable tiny right pleural effusion. No pneumothorax visualized after left chest tube removal.   Assessment/Plan: S/P Procedure(s) (LRB): CORONARY ARTERY BYPASS  GRAFTING (CABG) (N/A) TRANSESOPHAGEAL ECHOCARDIOGRAM (TEE) (N/A)  CV- BPs trending up, continue Lopressor and will start ACE-I. Maintaining SR.  Continue CRPI, pulm toilet.  D/c central line.  Possibly home 1-2 days if remains stable.   LOS: 3 days    COLLINS,GINA H 01/10/2014  patient examined and medical record reviewed,agree with above note. VAN TRIGT III,Manuelito Poage 01/10/2014

## 2014-01-10 NOTE — Progress Notes (Signed)
Central line removed per order. Occlusive dressing and pressure applied. Pt instructed of 30 minute bedrest. Verbalized understanding. Will continue to monitor pt closely. 

## 2014-01-10 NOTE — Progress Notes (Signed)
Pt returning from ambulating to room for transfer.  Pt anticipates d/c on Monday.  Attempted to review information with pt.  Pt asked could instructions be given when pt's wife who is the primary caregiver was able to be present.  Pt wife due to arrive later this evening post rehab staff ending time.  Informed pt that information could be reviewed on Monday.  Pt given handouts that included heart healthy eating, exercise guidelines and brochure regarding [facility] for her to review over the weekend.  Pt in agreement of this.  Alanson Alyarlette Carlton RN, BSN

## 2014-01-10 NOTE — Progress Notes (Signed)
Pt and belongings transferred to 2W03. Report given to receiving RN. Pt walked 300 ft to room with stand by assistance. Pt with no complaints. Wife updated of new location. Will continue to monitor pt closely.

## 2014-01-11 NOTE — Progress Notes (Signed)
Pt ambulated 450 ft with no complaints of pain, SOB, or dizzness.  To recliner after walk with family at side.  Will con't plan of care.

## 2014-01-11 NOTE — Progress Notes (Signed)
EPW's and Ct sutures discontinued per order and unit protocol.  Pt tolerated extremely well. All tips intact, VSS, see flowsheet.  CCMD notified. Sites painted and steri's applied.  Pt understands bed rest for one hour.  Call bell in reach, will monitor closely.

## 2014-01-11 NOTE — Op Note (Signed)
NAMFerman [name]:  [name], [name]                ACCOUNT NO.:  [id number]  MEDICAL RECORD NO.:  [id number]  LOCATION:  2W03C                        FACILITY:  [facility]  PHYSICIAN:  [name], [name]    DATE OF BIRTH:  1960-08-08  DATE OF PROCEDURE:  01/07/2014 DATE OF DISCHARGE:                              OPERATIVE REPORT   PREOPERATIVE DIAGNOSIS:  Progressive unstable angina with coronary occlusive disease.  POSTOPERATIVE DIAGNOSIS:  Progressive unstable angina with coronary occlusive disease.  SURGICAL PROCEDURE:  Coronary artery bypass grafting x2 with left internal mammary to the left anterior descending coronary artery and reverse saphenous vein graft to the distal right coronary artery with right endo-vein harvesting.  SURGEON:  [name], [name]  FIRST ASSISTANT:  Vincent Fudgeonielle Zimmerman, PA  BRIEF HISTORY:  The patient is a 53 year old male who presented [place] Dr. Welton [name] [place].  Evaluation including cardiac CT, echocardiogram, and Cardiolite stress test was performed which demonstrated significant anterior apical hypokinesis.  Cardiac catheterization revealed a patent circumflex with luminal irregularities, totally occluded LAD with thin collateral filling from the right coronary artery with 70-80% stenosis of the right coronary artery after the takeoff of acute marginal branch. Because of the patient's progressive symptoms of angina with minimal activity, coronary artery bypass grafting was recommended to the patient who agreed and signed informed consent.  DESCRIPTION OF PROCEDURE:  With Swan-Ganz and arterial line monitors in place, the patient underwent general endotracheal anesthesia without incident.  Skin of chest and legs were prepped with Betadine and draped in usual sterile manner.  Using the [employer]ant endo vein harvesting system, segment of vein was harvested from the right thigh and was of good quality and  caliber.  Median sternotomy was performed.  Left internal mammary artery was dissected down as a pedicle graft.  The distal artery was divided, had good free flow.  Pericardium was opened.  The patient had obvious anterior apical hypo and akinesis, though on echo at the time of surgery there did not appear to be a true ventricular aneurysm. The patient was systemically heparinized.  The ascending aorta was cannulated.  The right atrium was cannulated.  Aortic root vent cardioplegia needle was introduced into the ascending aorta.  The patient was placed on cardiopulmonary bypass 2.4 L/min/m2.  Sites of anastomosis were selected and dissected out the epicardium.  The patient's body temperature cooled to 32 degrees.  Aortic cross-clamp was applied and 500 mL cold blood potassium cardioplegia was administered with diastolic arrest of heart.  Myocardial septal temperature was monitored throughout the cross-clamp.  Attention was turned first to the distal right coronary artery which was relatively small vessel but did admit a 1.5 mm probe distally.  Using a running 7-0 Prolene, distal anastomosis was performed with a second reverse saphenous vein graft. Additional cold blood cardioplegia was administered down the vein graft. Attention was then turned to the left anterior descending coronary artery which was opened and was 1.3 mm in size distally and 1.5 mm size proximally.  Using running 8-0 Prolene, left internal mammary artery was anastomosed to the left anterior descending coronary artery.  With the bulldog still in place,  a single punch aortotomy was performed and a vein graft to the right coronary artery was anastomosed to the ascending aorta.  Heart was allowed to passively fill and deaired and the proximal anastomosis was completed.  The bulldog on the mammary artery was removed with rise in myocardial septal temperature.  Aortic crossclamp was then removed with total cross-clamp time of  52 minutes.  The patient spontaneously converted to a sinus rhythm.  Atrial and ventricular pacing wires were applied.  Graft marker was applied.  The patient was started on low-dose milrinone and dopamine infusion.  With his body temperature rewarmed, he was then ventilated and weaned from cardiopulmonary bypass.  He remained hemodynamically stable.  TEE did show overall improved global LV function.  The patient remained hemodynamically stable on low-dose dopamine and milrinone and he was decannulated in usual fashion.  Protamine sulfate was administered with operative field hemostatic.  Atrial and ventricular pacing wires were applied.  Graft marker was applied, a left pleural tube and a Blake mediastinal drain were left in place.  Pericardium was loosely reapproximated.  Sternum was closed with #6 stainless steel wire. Fascia was closed with interrupted 0 Vicryl, running 3-0 Vicryl in subcutaneous tissue, and 4-0 subcuticular stitch in skin edges.  Dry dressings were applied.  Sponge and needle count was reported as correct at completion of procedure.  The patient tolerated procedure without obvious complication and was transferred to Surgical Intensive Care Unit for further postoperative care.     [name], [name]     EG/MEDQ  D:  01/11/2014  T:  01/11/2014  Job:  [id number]  cc:   Dr. Santo HeldS [name]

## 2014-01-11 NOTE — Progress Notes (Addendum)
       [address]       [place], [id number]             [phone]          4 Days Post-Op Procedure(s) (LRB): CORONARY ARTERY BYPASS GRAFTING (CABG) (N/A) TRANSESOPHAGEAL ECHOCARDIOGRAM (TEE) (N/A)  Subjective: Feels well, no complaints.   Objective: Vital signs in last 24 hours: Patient Vitals for the past 24 hrs:  BP Temp Temp src Pulse Resp Weight  01/11/14 0641 - - - - - 153 lb (69.4 kg)  01/11/14 0502 104/61 mmHg 98.2 F (36.8 C) Oral 72 (!) 22 -  01/10/14 2118 125/67 mmHg 98.1 F (36.7 C) Oral 87 20 -  01/10/14 1021 122/79 mmHg - - 89 - -   Current Weight  01/11/14 153 lb (69.4 kg)  BASELINE WEIGHT: 70 kg   Intake/Output from previous day: 12/12 0701 - 12/13 0700 In: -  Out: 550 [Urine:550]    PHYSICAL EXAM:  Heart: RRR Lungs: Clear Wound: Clean and dry Extremities: Minimal LE edema   Lab Results: CBC: Recent Labs  01/09/14 0400 01/10/14 0310  WBC 12.8* 9.1  HGB 12.7* 10.4*  HCT 36.9* 30.6*  PLT 117* 101*   BMET:  Recent Labs  01/09/14 0400 01/10/14 0310  NA 135* 135*  K 4.5 4.0  CL 97 96  CO2 26 26  GLUCOSE 108* 111*  BUN 20 13  CREATININE 0.76 0.66  CALCIUM 8.7 8.6    PT/INR: No results for input(s): LABPROT, INR in the last 72 hours.    Assessment/Plan: S/P Procedure(s) (LRB): CORONARY ARTERY BYPASS GRAFTING (CABG) (N/A) TRANSESOPHAGEAL ECHOCARDIOGRAM (TEE) (N/A)  CV- BPs improving, continue Lopressor, ACE-I. Maintaining SR.  Continue CRPI, pulm toilet.  Will d/c EPWs today.  Home in am if remains stable.   LOS: 4 days    [name],Vincent H 01/11/2014  Patient appears to be ready for discharge tomorrow Wound care instructions and smoking cessation recommendation discussed with patient

## 2014-01-12 MED ORDER — METOPROLOL TARTRATE 25 MG PO TABS: 12.5000 mg | ORAL_TABLET | Freq: Two times a day (BID) | ORAL | Status: DC

## 2014-01-12 MED ORDER — OXYCODONE HCL 5 MG PO TABS: 5.0000 mg | ORAL_TABLET | ORAL | Status: DC | PRN

## 2014-01-12 MED ORDER — ASPIRIN 325 MG PO TBEC: 325.0000 mg | DELAYED_RELEASE_TABLET | Freq: Every day | ORAL | Status: DC

## 2014-01-12 MED ORDER — SIMVASTATIN 20 MG PO TABS: 20.0000 mg | ORAL_TABLET | Freq: Every day | ORAL | Status: DC

## 2014-01-12 MED ORDER — LISINOPRIL 5 MG PO TABS: 5.0000 mg | ORAL_TABLET | Freq: Every day | ORAL | Status: DC

## 2014-01-12 NOTE — Progress Notes (Signed)
      [address]       [place] [id number]             [phone]        5 Days Post-Op Procedure(s) (LRB): CORONARY ARTERY BYPASS GRAFTING (CABG) (N/A) TRANSESOPHAGEAL ECHOCARDIOGRAM (TEE) (N/A)  Subjective: Patient without complaints. He wants to go home.  Objective: Vital signs in last 24 hours: Temp:  [98.1 F (36.7 C)-98.4 F (36.9 C)] 98.1 F (36.7 C) (12/14 0413) Pulse Rate:  [72-89] 72 (12/14 0413) Cardiac Rhythm:  [-] Normal sinus rhythm (12/13 2000) Resp:  [20-22] 20 (12/14 0413) BP: (112-120)/(58-72) 113/72 mmHg (12/14 0413) SpO2:  [97 %-99 %] 97 % (12/14 0413) Weight:  [151 lb 7.3 oz (68.7 kg)] 151 lb 7.3 oz (68.7 kg) (12/14 0413)  Pre op weight 70 kg Current Weight  01/12/14 151 lb 7.3 oz (68.7 kg)      Intake/Output from previous day: 12/13 0701 - 12/14 0700 In: 480 [P.O.:480] Out: 1 [Stool:1]   Physical Exam:  Cardiovascular: RRR, no murmurs, gallops, or rubs. Pulmonary: Clear to auscultation bilaterally; no rales, wheezes, or rhonchi. Abdomen: Soft, non tender, bowel sounds present. Extremities: No lower extremity edema. Wounds: Clean and dry.  No erythema or signs of infection.  Lab Results: CBC: Recent Labs  01/10/14 0310  WBC 9.1  HGB 10.4*  HCT 30.6*  PLT 101*   BMET:  Recent Labs  01/10/14 0310  NA 135*  K 4.0  CL 96  CO2 26  GLUCOSE 111*  BUN 13  CREATININE 0.66  CALCIUM 8.6    PT/INR:  Lab Results  Component Value Date   INR 1.44 01/07/2014   INR 1.08 01/05/2014   ABG:  INR: Will add last result for INR, ABG once components are confirmed Will add last 4 CBG results once components are confirmed  Assessment/Plan:  1. CV - SR in the 70's. On Lopressor 12.5 bid, Lisinopril 5 daily. 2.  Pulmonary - Encourage incentive spirometer 3. Pre op HGA1C 6. He is likely pre diabetic. Will need follow up with medical doctor after discharge 4.  Acute blood loss anemia - Last H and H stable at 10.4 and  30.6 5. Thrombocytopenia-platelets 101,000 6. Discharge  ZIMMERMAN,DONIELLE MPA-C 01/12/2014,7:18 AM

## 2014-01-12 NOTE — Progress Notes (Signed)
Pt/family given discharge instructions, medication lists, follow up appointments, and when to call the doctor.  Pt/family verbalizes understanding. Pt given signs and symptoms of infection. Hugh Garrow McClintock, RN    

## 2014-01-12 NOTE — Progress Notes (Signed)
[phone] Education completed with pt and wife who voiced understanding. Encouraged IS. Discussed not smoking cigars and gave smoking cessation handouts. Pt can call [phone] if needed. Discussed need to watch sugars and carbs with HGA1C at 6. Discussed CRP 2 and [facility]. Put on discharge video for viewing. Gave heart healthy diet. Pt stated he walked yesterday on RA with steady gait. Luetta NuttingCharlene Lilie Vezina RN BSN 01/12/2014 9:07 AM

## 2014-01-12 NOTE — Discharge Instructions (Signed)
Activity: 1.May walk up steps                2.No lifting more than ten pounds for four weeks.                 3.No driving for four weeks.                4.Stop any activity that causes chest pain, shortness of breath, dizziness, sweating or excessive weakness.                5.Avoid straining.                6.Continue with your breathing exercises daily.  Diet: Diabetic diet and Low fat, Low salt  diet  Wound Care: May shower.  Clean wounds with mild soap and water daily. Contact the office at [phone] if any problems arise. The patient has been reminded to continue to abstain from cigarette smoking for the rest of his life.  Coronary Artery Bypass Grafting, Care After Refer to this sheet in the next few weeks. These instructions provide you with information on caring for yourself after your procedure. Your health care provider may also give you more specific instructions. Your treatment has been planned according to current medical practices, but problems sometimes occur. Call your health care provider if you have any problems or questions after your procedure. WHAT TO EXPECT AFTER THE PROCEDURE Recovery from surgery will be different for everyone. Some people feel well after 3 or 4 weeks, while for others it takes longer. After your procedure, it is typical to have the following:  Nausea and a lack of appetite.   Constipation.  Weakness and fatigue.   Depression or irritability.   Pain or discomfort at your incision site. HOME CARE INSTRUCTIONS  Take medicines only as directed by your health care provider. Do not stop taking medicines or start any new medicines without first checking with your health care provider.  Take your pulse as directed by your health care provider.  Perform deep breathing as directed by your health care provider. If you were given a device called an incentive spirometer, use it to practice deep breathing several times a day. Support your chest with  a pillow or your arms when you take deep breaths or cough.  Keep incision areas clean, dry, and protected. Remove or change any bandages (dressings) only as directed by your health care provider. You may have skin adhesive strips over the incision areas. Do not take the strips off. They will fall off on their own.  Check incision areas daily for any swelling, redness, or drainage.  If incisions were made in your legs, do the following:  Avoid crossing your legs.   Avoid sitting for long periods of time. Change positions every 30 minutes.   Elevate your legs when you are sitting.  Wear compression stockings as directed by your health care provider. These stockings help keep blood clots from forming in your legs.  Take showers once your health care provider approves. Until then, only take sponge baths. Pat incisions dry. Do not rub incisions with a washcloth or towel. Do not take baths, swim, or use a hot tub until your health care provider approves.  Eat foods that are high in fiber, such as raw fruits and vegetables, whole grains, beans, and nuts. Meats should be lean cut. Avoid canned, processed, and fried foods.  Drink enough fluid to keep your urine clear or pale yellow.  Weigh yourself every day. This helps identify if you are retaining fluid that may make your heart and lungs work harder.  Rest and limit activity as directed by your health care provider. You may be instructed to:  Stop any activity at once if you have chest pain, shortness of breath, irregular heartbeats, or dizziness. Get help right away if you have any of these symptoms.  Move around frequently for short periods or take short walks as directed by your health care provider. Increase your activities gradually. You may need physical therapy or cardiac rehabilitation to help strengthen your muscles and build your endurance.  Avoid lifting, pushing, or pulling anything heavier than 10 lb (4.5 kg) for at least 6  weeks after surgery.  Do not drive until your health care provider approves.  Ask your health care provider when you may return to work.  Ask your health care provider when you may resume sexual activity.  Keep all follow-up visits as directed by your health care provider. This is important. SEEK MEDICAL CARE IF:  You have swelling, redness, increasing pain, or drainage at the site of an incision.  You have a fever.  You have swelling in your ankles or legs.  You have pain in your legs.   You gain 2 or more pounds (0.9 kg) a day.  You are nauseous or vomit.  You have diarrhea. SEEK IMMEDIATE MEDICAL CARE IF:  You have chest pain that goes to your jaw or arms.  You have shortness of breath.   You have a fast or irregular heartbeat.   You notice a "clicking" in your breastbone (sternum) when you move.   You have numbness or weakness in your arms or legs.  You feel dizzy or light-headed.  MAKE SURE YOU:  Understand these instructions.  Will watch your condition.  Will get help right away if you are not doing well or get worse. Document Released: 08/05/2004 Document Revised: 06/02/2013 Document Reviewed: 06/25/2012 [facility] Patient Information 2015 [employer] [place]. This information is not intended to replace advice given to you by your health care provider. Make sure you discuss any questions you have with your health care provider.

## 2014-02-11 ENCOUNTER — Encounter: Payer: Self-pay | Admitting: Cardiovascular Disease

## 2014-02-12 ENCOUNTER — Other Ambulatory Visit: Payer: Self-pay | Admitting: Cardiothoracic Surgery

## 2014-02-12 DIAGNOSIS — R9430 Abnormal result of cardiovascular function study, unspecified: Secondary | ICD-10-CM

## 2014-02-16 ENCOUNTER — Ambulatory Visit (INDEPENDENT_AMBULATORY_CARE_PROVIDER_SITE_OTHER): Payer: Self-pay | Admitting: Physician Assistant

## 2014-02-16 VITALS — BP 113/74 | HR 75 | Resp 16 | Ht 64.0 in | Wt 151.5 lb

## 2014-02-16 DIAGNOSIS — Z951 Presence of aortocoronary bypass graft: Secondary | ICD-10-CM

## 2014-02-16 MED ORDER — ALPRAZOLAM 0.25 MG PO TABS: 0.2500 mg | ORAL_TABLET | Freq: Two times a day (BID) | ORAL | Status: DC | PRN

## 2014-02-16 MED ORDER — TRAMADOL HCL 50 MG PO TABS: 50.0000 mg | ORAL_TABLET | Freq: Four times a day (QID) | ORAL | Status: DC | PRN

## 2014-02-16 NOTE — Progress Notes (Signed)
  HPI: Patient returns for routine postoperative follow-up having undergone CABG x 2 on 01/07/2014. The patient's early postoperative recovery while in the hospital was unremarkable. Since hospital discharge the patient reports he is doing okay.  He continues to have some chest discomfort however his wife states that he has not been observing his sternal precautions.  I explained to the patient that he will continue to experience incisional discomfort if he does not observe his sternal precautions as instructed.  He also is continuing to have some issues with anxiety.  They state cardiac rehab recommends he talk to a psychiatrist because he also has a history Bipolar disorder.  He is ambulating without difficulty.  His appetite is back to normal as well.  He continues to smoke, but states this has decreased and he is continuing to try and quit.   Current Outpatient Prescriptions  Medication Sig Dispense Refill  . acetaminophen (TYLENOL) 500 MG tablet Take 1,000 mg by mouth every 6 (six) hours as needed.    Marland Kitchen. aspirin EC 325 MG EC tablet Take 1 tablet (325 mg total) by mouth daily. 30 tablet 0  . metoprolol tartrate (LOPRESSOR) 25 MG tablet Take 0.5 tablets (12.5 mg total) by mouth 2 (two) times daily. 30 tablet 1  . simvastatin (ZOCOR) 20 MG tablet Take 1 tablet (20 mg total) by mouth daily at 6 PM. 30 tablet 1  . ALPRAZolam (XANAX) 0.25 MG tablet Take 1 tablet (0.25 mg total) by mouth 2 (two) times daily as needed for anxiety. (Patient not taking: Reported on 02/16/2014) 10 tablet 0  . lisinopril (PRINIVIL,ZESTRIL) 5 MG tablet Take 1 tablet (5 mg total) by mouth daily. (Patient not taking: Reported on 02/16/2014) 30 tablet 1   No current facility-administered medications for this visit.    Physical Exam:  Gen: no apparent distress Heart; RRR Lungs: CTA Bilaterally Ext: no LE edema appreciated Wounds: clean and dry, well healed  Diagnostic Tests:  CXR: disk reviewed, normal post surgical  changes, no evidence of pleural effusion/pneumothorax  A/P:  1. S/P CABG- doing well.  He was encouraged to continue and try to quit smoking.  He was again educated on the importance of sternal precautions.  He has asked to return to work and a slip was given with instructions for light duty 2. Pain control- provided prescription for Tramadol, as patient states Dr. Welton FlakesKhan told him to not use Ibuprofen 3. Anxiety- stressed importance of patient obtaining a PCP.  I will give him a one month supply on his xanax.  He understands that no more prescriptions will be given 4. RTC in  6-8 weeks, at that time if he continues to progress we will release him back to full work load

## 2014-02-19 ENCOUNTER — Ambulatory Visit: Payer: 59 | Admitting: Cardiothoracic Surgery

## 2014-03-02 ENCOUNTER — Encounter: Payer: Self-pay | Admitting: Cardiovascular Disease

## 2014-03-31 ENCOUNTER — Encounter: Payer: Self-pay | Admitting: Cardiovascular Disease

## 2014-04-09 ENCOUNTER — Ambulatory Visit: Payer: 59 | Admitting: Cardiothoracic Surgery

## 2016-01-06 IMAGING — CR DG CHEST 2V
2 series · 2 of 2 positions shown · non-contrast
Comparison: 12/08/2013

CLINICAL DATA: Pre-admission for heart surgery

EXAM:
CHEST  2 VIEW

[w chest pa (1 of 2)]
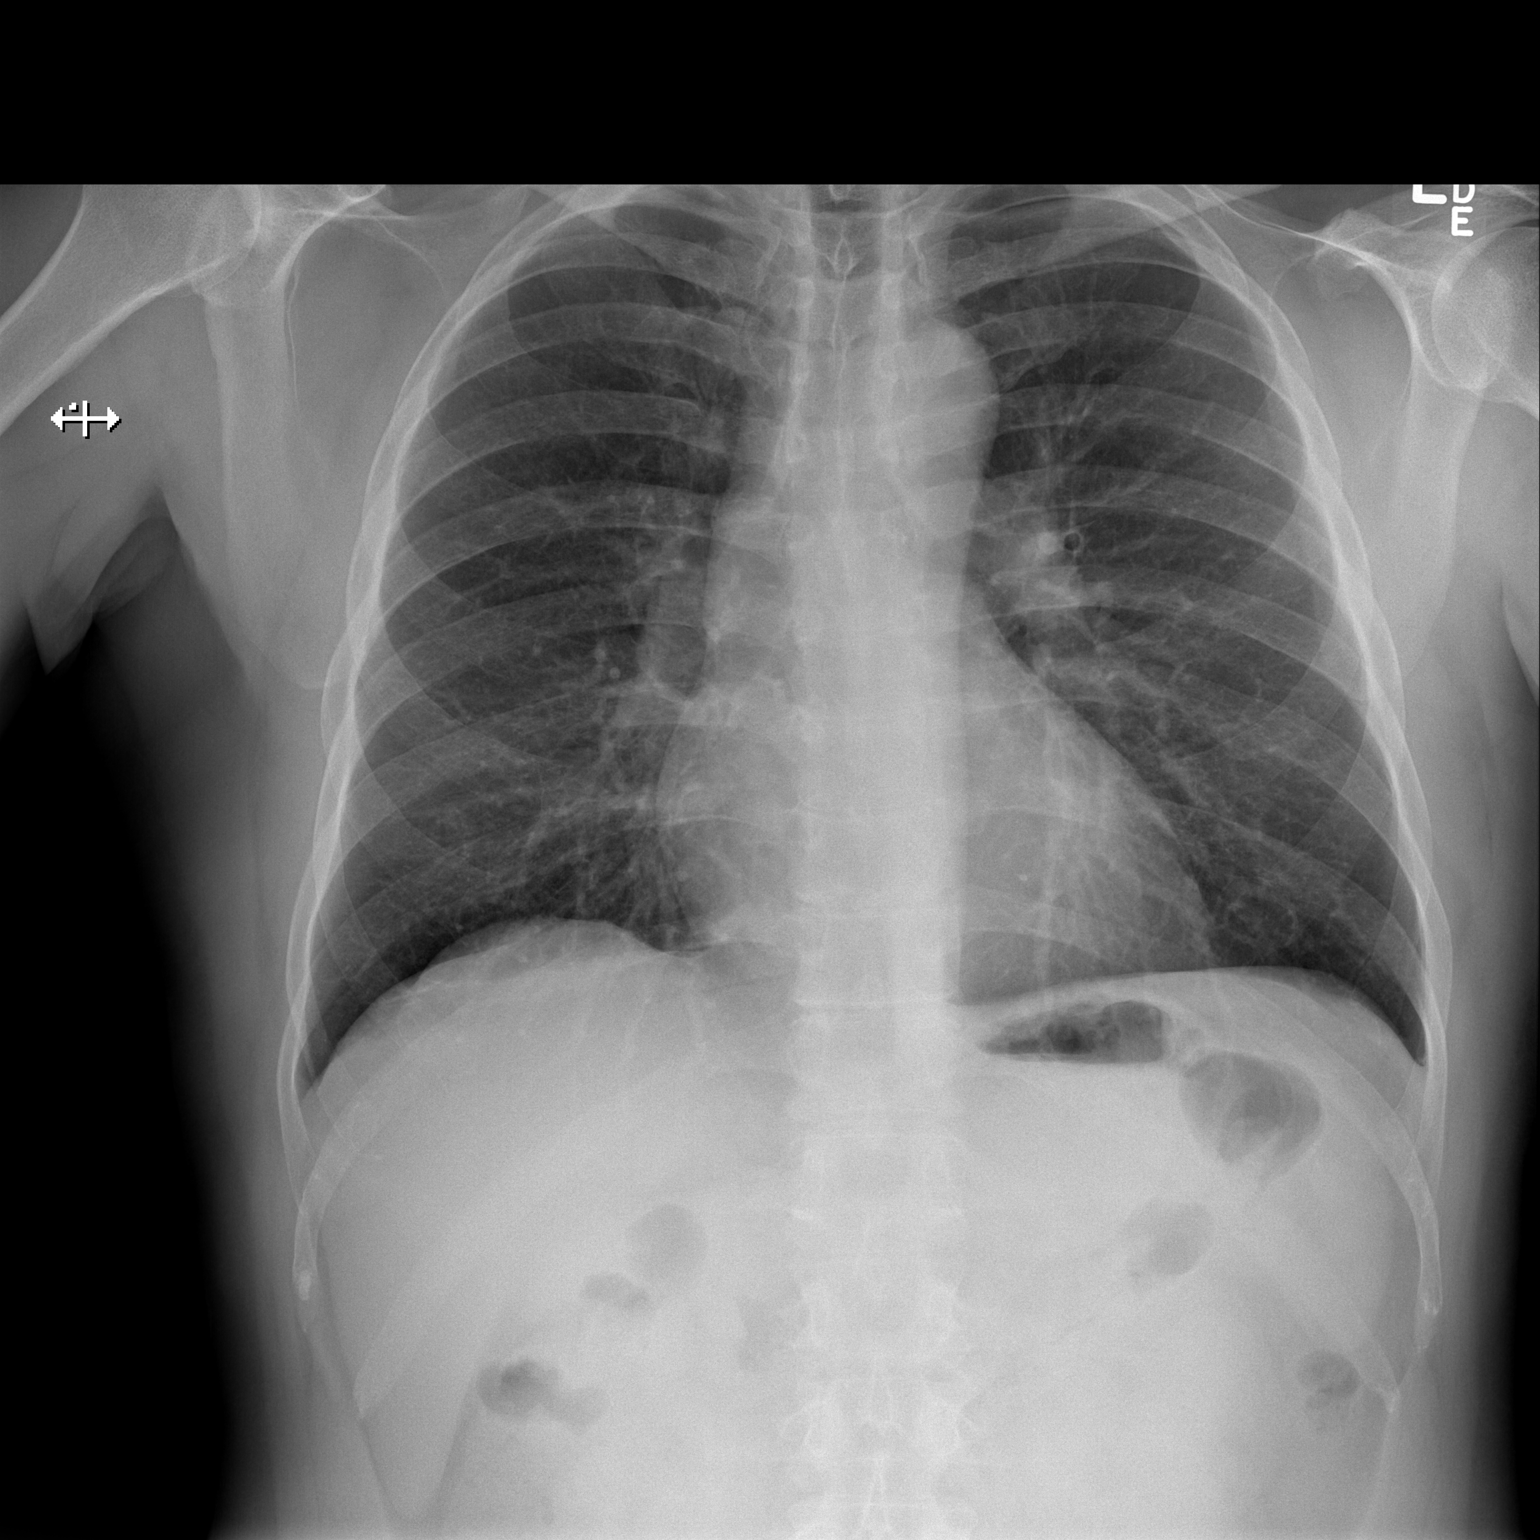

[w chest pa (2 of 2)]
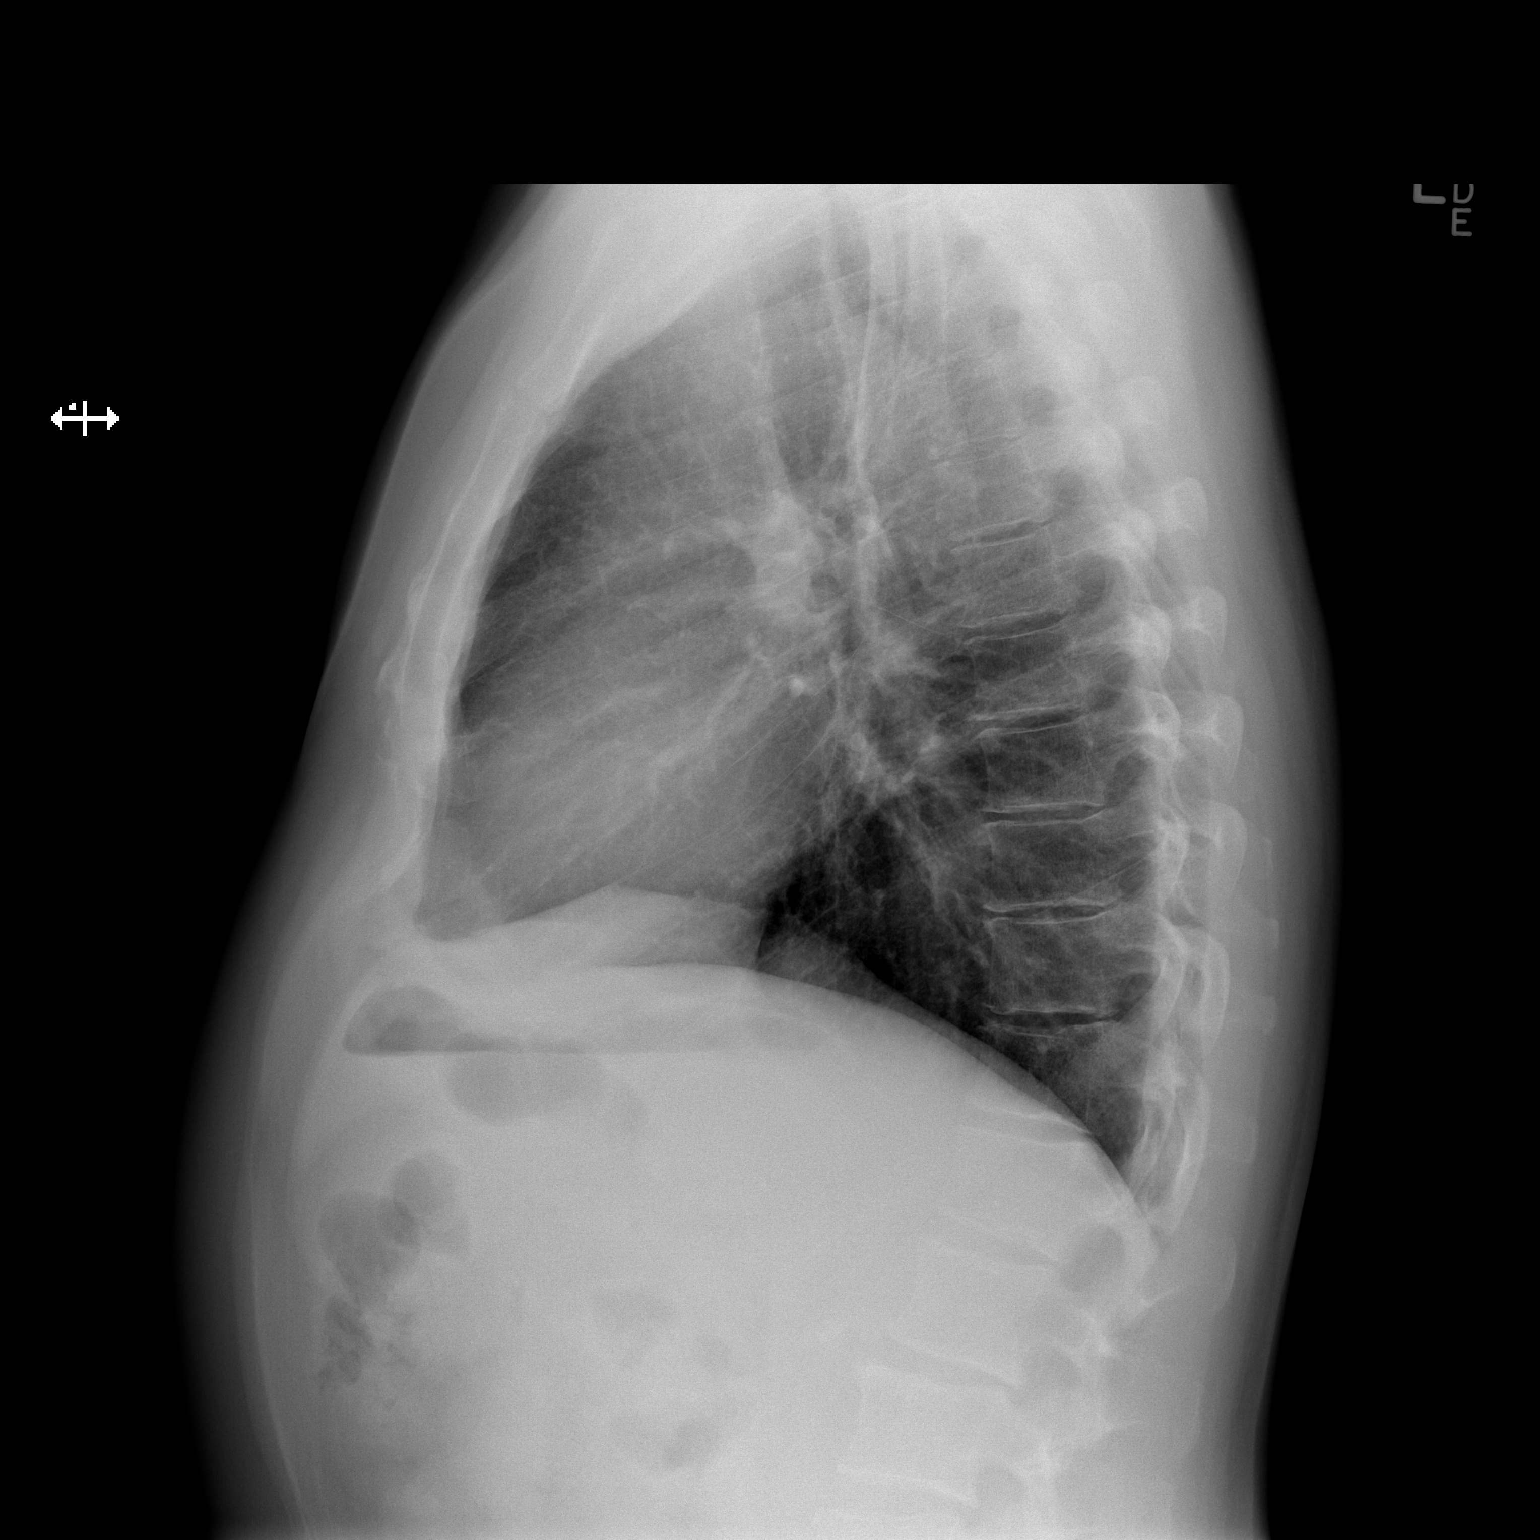

[2 of 2 positions shown; findings below may reference images not displayed]

FINDINGS: Cardiomediastinal silhouette is stable. No acute infiltrate or
pleural effusion. No pulmonary edema. Bony thorax is unremarkable.
IMPRESSION: No active cardiopulmonary disease.

## 2016-01-09 IMAGING — CR DG CHEST 1V PORT
1 series · 1 of 1 positions shown · non-contrast
Comparison: 01/07/2014

CLINICAL DATA: Status post coronary bypass grafting

EXAM:
PORTABLE CHEST - 1 VIEW

[portable]
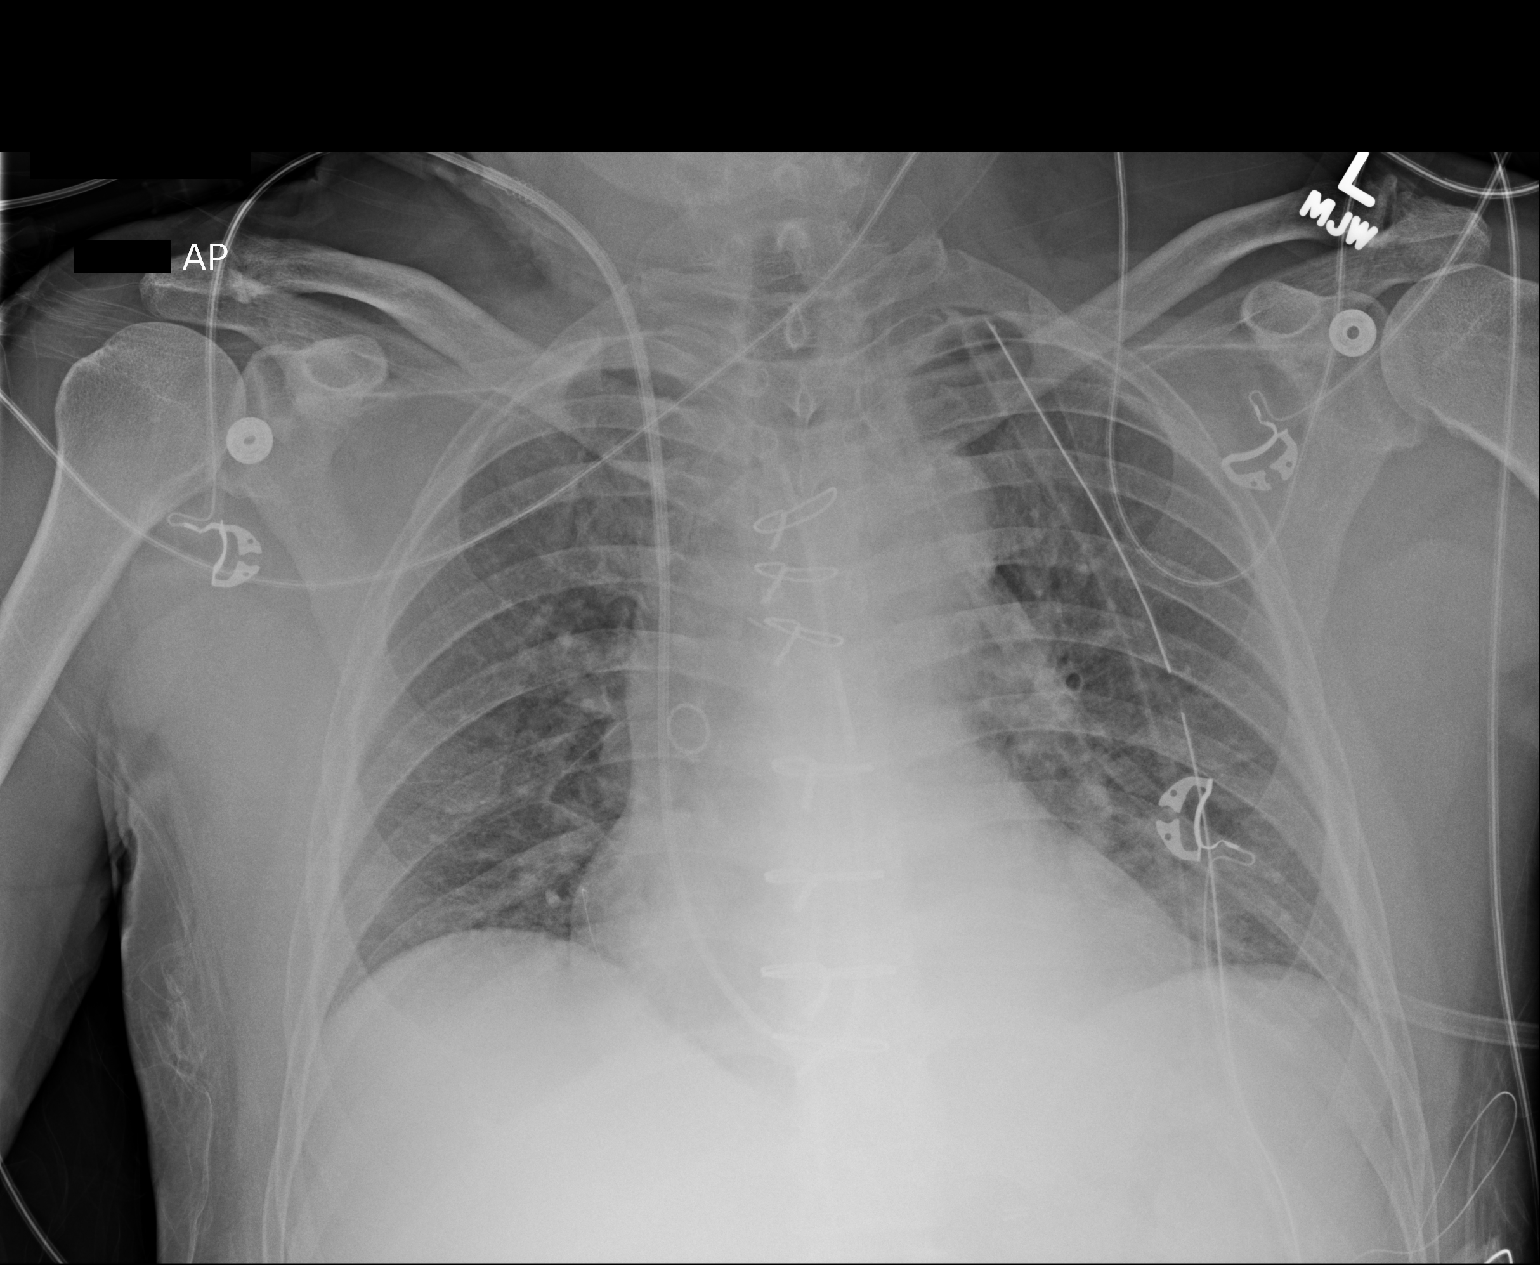

[1 of 1 positions shown; findings below may reference images not displayed]

FINDINGS: Cardiac shadow is stable. Postoperative changes are again seen.
Left-sided thoracostomy catheter is again noted and stable. A
mediastinal drain is seen. The endotracheal tube and nasogastric
catheter have been removed. A Swan-Ganz catheter remains in the
pulmonary outflow tract. The lungs are well aerated bilaterally
without focal infiltrate or sizable effusion. No acute bony
abnormality is seen.
IMPRESSION: Postoperative change with tubes and lines as described. No acute
abnormality is seen.

## 2016-01-11 IMAGING — DX DG CHEST 2V
2 series · 2 of 2 positions shown · non-contrast
Comparison: 01/09/2014

CLINICAL DATA: Postop from CABG.  Coronary artery disease.

EXAM:
CHEST  2 VIEW

[chest pa]
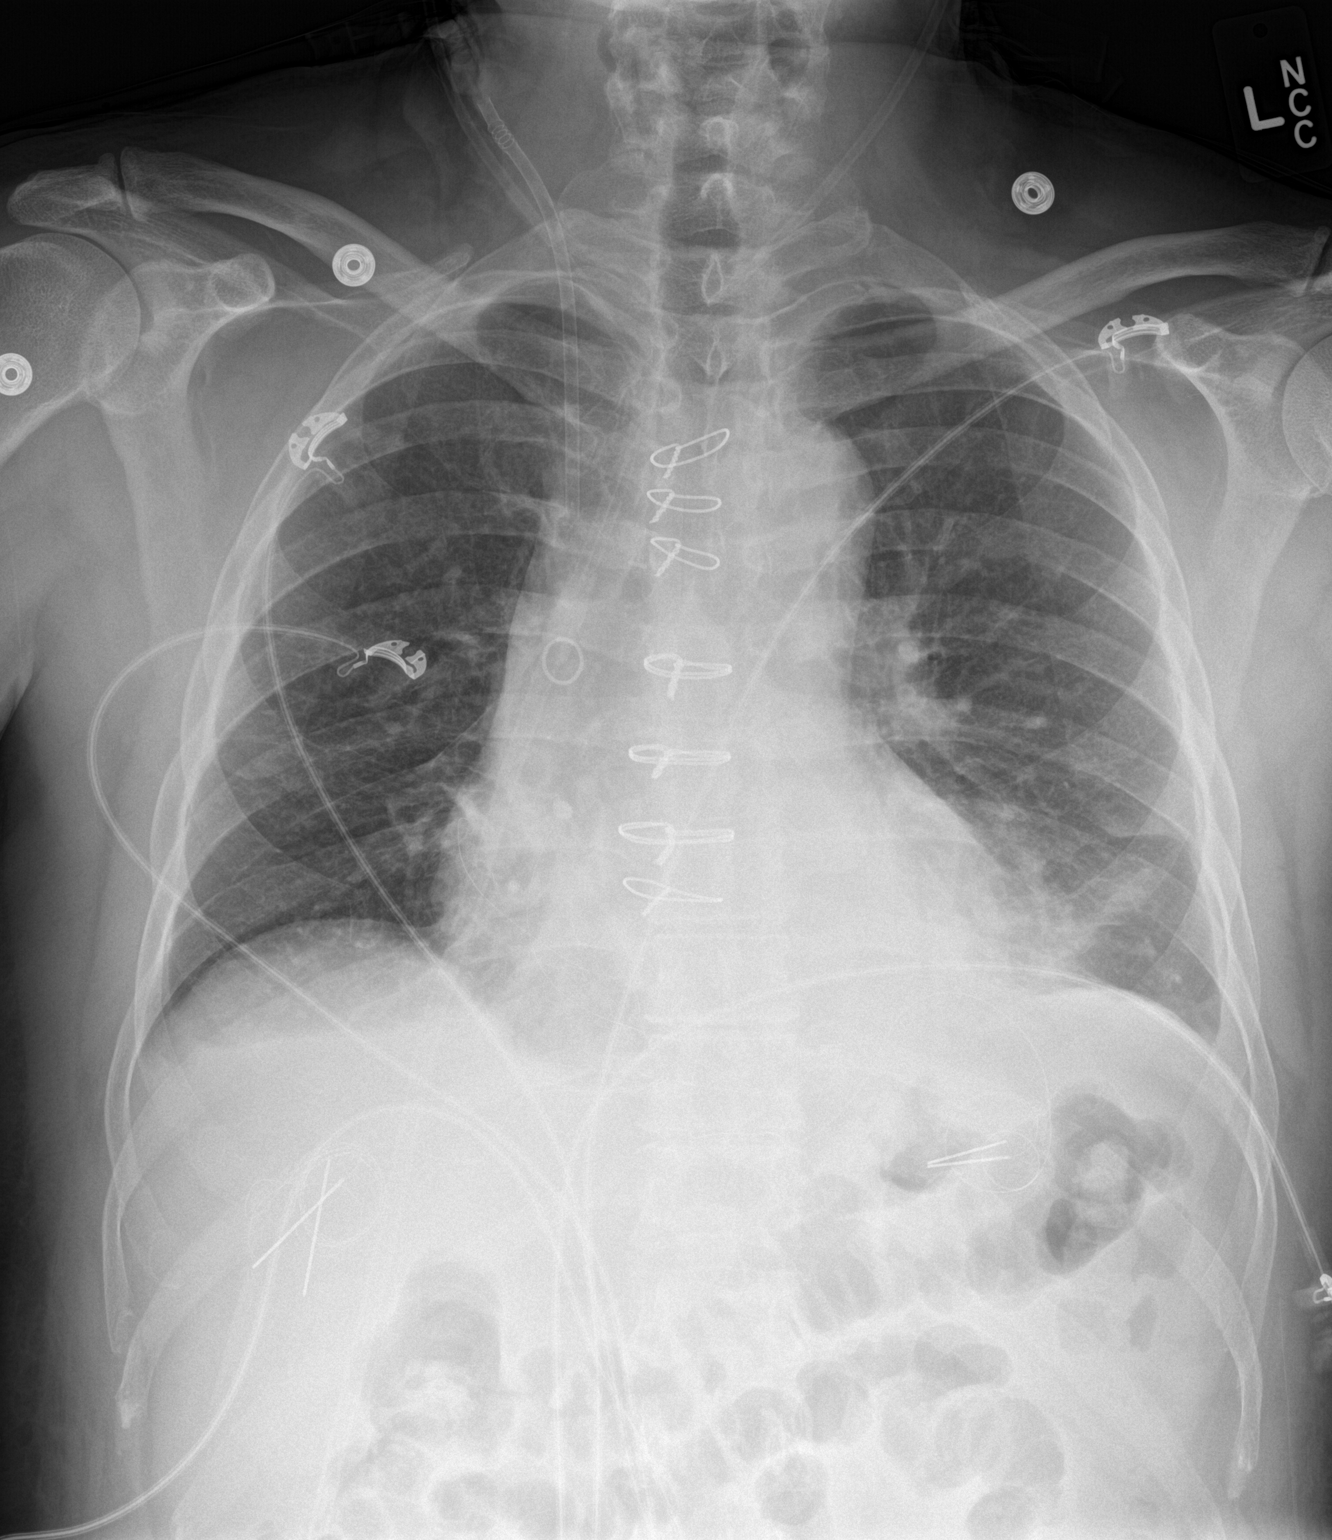

[chest lat]
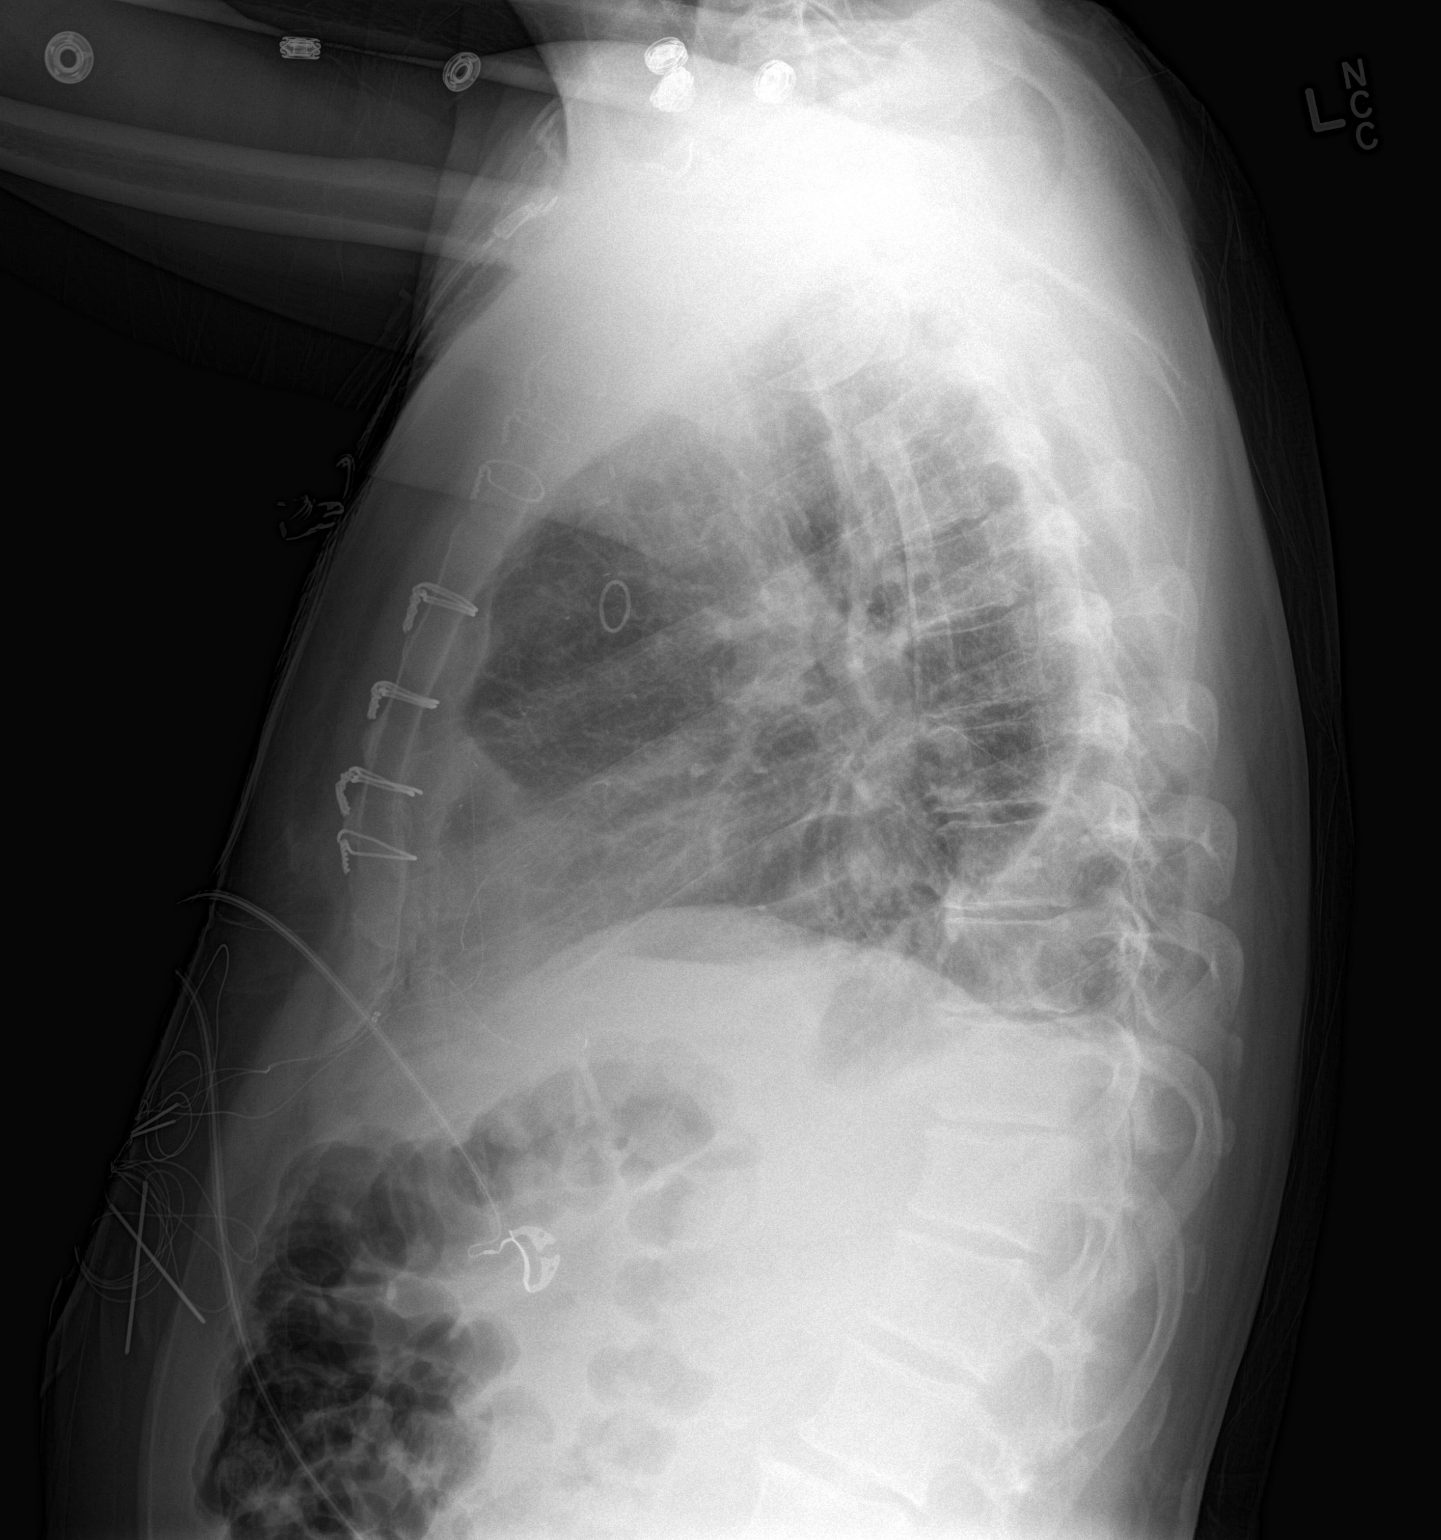

[2 of 2 positions shown; findings below may reference images not displayed]

FINDINGS: Left chest tube has been removed. No pneumothorax identified.
Decreased atelectasis seen in left lung base. Probable tiny right
pleural effusion noted. Mild cardiomegaly stable.
IMPRESSION: Decreased left lower lobe atelectasis and probable tiny right
pleural effusion. No pneumothorax visualized after left chest tube
removal.

## 2019-01-31 HISTORY — PX: REFRACTIVE SURGERY: SHX103

## 2019-02-10 DIAGNOSIS — F419 Anxiety disorder, unspecified: Secondary | ICD-10-CM | POA: Diagnosis not present

## 2019-02-10 DIAGNOSIS — R7303 Prediabetes: Secondary | ICD-10-CM | POA: Diagnosis not present

## 2019-02-10 DIAGNOSIS — I25708 Atherosclerosis of coronary artery bypass graft(s), unspecified, with other forms of angina pectoris: Secondary | ICD-10-CM | POA: Diagnosis not present

## 2019-02-10 DIAGNOSIS — R0602 Shortness of breath: Secondary | ICD-10-CM | POA: Diagnosis not present

## 2019-05-23 DIAGNOSIS — R7303 Prediabetes: Secondary | ICD-10-CM | POA: Diagnosis not present

## 2019-05-23 DIAGNOSIS — I25708 Atherosclerosis of coronary artery bypass graft(s), unspecified, with other forms of angina pectoris: Secondary | ICD-10-CM | POA: Diagnosis not present

## 2019-05-23 DIAGNOSIS — F419 Anxiety disorder, unspecified: Secondary | ICD-10-CM | POA: Diagnosis not present

## 2019-05-23 DIAGNOSIS — Z131 Encounter for screening for diabetes mellitus: Secondary | ICD-10-CM | POA: Diagnosis not present

## 2019-05-23 DIAGNOSIS — R0602 Shortness of breath: Secondary | ICD-10-CM | POA: Diagnosis not present

## 2019-05-29 DIAGNOSIS — L812 Freckles: Secondary | ICD-10-CM | POA: Diagnosis not present

## 2019-05-29 DIAGNOSIS — L819 Disorder of pigmentation, unspecified: Secondary | ICD-10-CM | POA: Diagnosis not present

## 2019-05-29 DIAGNOSIS — L821 Other seborrheic keratosis: Secondary | ICD-10-CM | POA: Diagnosis not present

## 2019-06-02 DIAGNOSIS — F419 Anxiety disorder, unspecified: Secondary | ICD-10-CM | POA: Diagnosis not present

## 2019-06-02 DIAGNOSIS — R7303 Prediabetes: Secondary | ICD-10-CM | POA: Diagnosis not present

## 2019-06-02 DIAGNOSIS — I25708 Atherosclerosis of coronary artery bypass graft(s), unspecified, with other forms of angina pectoris: Secondary | ICD-10-CM | POA: Diagnosis not present

## 2019-06-02 DIAGNOSIS — E875 Hyperkalemia: Secondary | ICD-10-CM | POA: Diagnosis not present

## 2019-07-10 DIAGNOSIS — H18413 Arcus senilis, bilateral: Secondary | ICD-10-CM | POA: Diagnosis not present

## 2019-07-10 DIAGNOSIS — H2512 Age-related nuclear cataract, left eye: Secondary | ICD-10-CM | POA: Diagnosis not present

## 2019-07-10 DIAGNOSIS — H25013 Cortical age-related cataract, bilateral: Secondary | ICD-10-CM | POA: Diagnosis not present

## 2019-07-10 DIAGNOSIS — H25043 Posterior subcapsular polar age-related cataract, bilateral: Secondary | ICD-10-CM | POA: Diagnosis not present

## 2019-07-10 DIAGNOSIS — H2513 Age-related nuclear cataract, bilateral: Secondary | ICD-10-CM | POA: Diagnosis not present

## 2019-07-23 DIAGNOSIS — Z961 Presence of intraocular lens: Secondary | ICD-10-CM | POA: Diagnosis not present

## 2019-07-23 DIAGNOSIS — H2512 Age-related nuclear cataract, left eye: Secondary | ICD-10-CM | POA: Diagnosis not present

## 2019-07-24 DIAGNOSIS — H2511 Age-related nuclear cataract, right eye: Secondary | ICD-10-CM | POA: Diagnosis not present

## 2019-08-13 DIAGNOSIS — Z961 Presence of intraocular lens: Secondary | ICD-10-CM | POA: Diagnosis not present

## 2019-08-13 DIAGNOSIS — H2511 Age-related nuclear cataract, right eye: Secondary | ICD-10-CM | POA: Diagnosis not present

## 2019-09-04 DIAGNOSIS — K047 Periapical abscess without sinus: Secondary | ICD-10-CM | POA: Diagnosis not present

## 2019-09-04 DIAGNOSIS — I25708 Atherosclerosis of coronary artery bypass graft(s), unspecified, with other forms of angina pectoris: Secondary | ICD-10-CM | POA: Diagnosis not present

## 2019-09-04 DIAGNOSIS — R7303 Prediabetes: Secondary | ICD-10-CM | POA: Diagnosis not present

## 2019-09-04 DIAGNOSIS — F419 Anxiety disorder, unspecified: Secondary | ICD-10-CM | POA: Diagnosis not present

## 2019-11-17 DIAGNOSIS — Z0000 Encounter for general adult medical examination without abnormal findings: Secondary | ICD-10-CM | POA: Diagnosis not present

## 2019-11-17 DIAGNOSIS — F419 Anxiety disorder, unspecified: Secondary | ICD-10-CM | POA: Diagnosis not present

## 2019-11-17 DIAGNOSIS — R7303 Prediabetes: Secondary | ICD-10-CM | POA: Diagnosis not present

## 2019-11-17 DIAGNOSIS — I25708 Atherosclerosis of coronary artery bypass graft(s), unspecified, with other forms of angina pectoris: Secondary | ICD-10-CM | POA: Diagnosis not present

## 2019-11-17 DIAGNOSIS — Z131 Encounter for screening for diabetes mellitus: Secondary | ICD-10-CM | POA: Diagnosis not present

## 2019-11-17 DIAGNOSIS — Z125 Encounter for screening for malignant neoplasm of prostate: Secondary | ICD-10-CM | POA: Diagnosis not present

## 2019-11-17 DIAGNOSIS — Z136 Encounter for screening for cardiovascular disorders: Secondary | ICD-10-CM | POA: Diagnosis not present

## 2019-11-17 DIAGNOSIS — R55 Syncope and collapse: Secondary | ICD-10-CM | POA: Diagnosis not present

## 2020-02-27 DIAGNOSIS — R0981 Nasal congestion: Secondary | ICD-10-CM | POA: Diagnosis not present

## 2020-02-27 DIAGNOSIS — U071 COVID-19: Secondary | ICD-10-CM | POA: Diagnosis not present

## 2020-08-27 DIAGNOSIS — M542 Cervicalgia: Secondary | ICD-10-CM | POA: Diagnosis not present

## 2020-08-27 DIAGNOSIS — F419 Anxiety disorder, unspecified: Secondary | ICD-10-CM | POA: Diagnosis not present

## 2020-08-27 DIAGNOSIS — I25708 Atherosclerosis of coronary artery bypass graft(s), unspecified, with other forms of angina pectoris: Secondary | ICD-10-CM | POA: Diagnosis not present

## 2020-08-27 DIAGNOSIS — E7800 Pure hypercholesterolemia, unspecified: Secondary | ICD-10-CM | POA: Diagnosis not present

## 2020-08-27 DIAGNOSIS — Z720 Tobacco use: Secondary | ICD-10-CM | POA: Diagnosis not present

## 2020-08-27 DIAGNOSIS — R7303 Prediabetes: Secondary | ICD-10-CM | POA: Diagnosis not present

## 2020-09-10 ENCOUNTER — Other Ambulatory Visit: Payer: Self-pay

## 2020-09-10 ENCOUNTER — Encounter: Payer: Self-pay | Admitting: Cardiology

## 2020-09-10 ENCOUNTER — Ambulatory Visit: Payer: BC Managed Care – PPO | Admitting: Cardiology

## 2020-09-10 VITALS — BP 155/89 | HR 79 | Temp 98.3°F | Resp 17 | Ht 64.0 in | Wt 161.0 lb

## 2020-09-10 DIAGNOSIS — I25110 Atherosclerotic heart disease of native coronary artery with unstable angina pectoris: Secondary | ICD-10-CM | POA: Diagnosis not present

## 2020-09-10 DIAGNOSIS — I739 Peripheral vascular disease, unspecified: Secondary | ICD-10-CM | POA: Diagnosis not present

## 2020-09-10 DIAGNOSIS — F17210 Nicotine dependence, cigarettes, uncomplicated: Secondary | ICD-10-CM

## 2020-09-10 DIAGNOSIS — I10 Essential (primary) hypertension: Secondary | ICD-10-CM

## 2020-09-10 DIAGNOSIS — R0600 Dyspnea, unspecified: Secondary | ICD-10-CM

## 2020-09-10 DIAGNOSIS — Z951 Presence of aortocoronary bypass graft: Secondary | ICD-10-CM

## 2020-09-10 DIAGNOSIS — R072 Precordial pain: Secondary | ICD-10-CM

## 2020-09-10 DIAGNOSIS — R0609 Other forms of dyspnea: Secondary | ICD-10-CM

## 2020-09-10 DIAGNOSIS — E782 Mixed hyperlipidemia: Secondary | ICD-10-CM | POA: Diagnosis not present

## 2020-09-10 DIAGNOSIS — R0989 Other specified symptoms and signs involving the circulatory and respiratory systems: Secondary | ICD-10-CM

## 2020-09-10 MED ORDER — METOPROLOL SUCCINATE ER 25 MG PO TB24: 25.0000 mg | ORAL_TABLET | Freq: Every day | ORAL | 3 refills | Status: DC

## 2020-09-10 MED ORDER — ATORVASTATIN CALCIUM 40 MG PO TABS: 40.0000 mg | ORAL_TABLET | Freq: Every day | ORAL | 3 refills | Status: DC

## 2020-09-10 MED ORDER — NICOTINE 21 MG/24HR TD PT24: 21.0000 mg | MEDICATED_PATCH | Freq: Every day | TRANSDERMAL | 0 refills | Status: DC

## 2020-09-10 MED ORDER — VALSARTAN 80 MG PO TABS: 80.0000 mg | ORAL_TABLET | Freq: Every day | ORAL | 3 refills | Status: DC

## 2020-09-10 NOTE — Progress Notes (Signed)
Primary Physician/Referring:  Trey Sailors, PA  [name], male    DOB: 1960-09-10, 60 y.o.   MRN: [id number]  Chief Complaint  Patient presents with   New Patient (Initial Visit)   Coronary Artery Disease    Ref by Raelyn Number, PA   HPI:    [name]  is a 60 y.o. Caucasian male with history of MI with subsequent CABG x2 in 2015, hypertension, hyperlipidemia, active tobacco use (25 pack year history).  Denies family history of premature CAD, history of diabetes.   Patient was seen by cardiology in 2015, however he was unfortunately lost to follow-up until now.  Patient is now referred to our office for cardiovascular management given history of CAD.  Patient reports over the last 1 year he has had worsening dyspnea on exertion as well as intermittent episodes of chest pain which he describes as sharp lasting several seconds and primarily associated with exertion.  Notably patient has independently discontinued his cardiac meds including Beta-blocker, statin therapy, and ARB.   Patient states he is now motivated to restart his cardiovascular medications and "get back on track".  Past Medical History:  Diagnosis Date   Anginal pain ([place])    Anxiety    Hypertension    Myocardial infarction [facility])    Neuropathy    Past Surgical History:  Procedure Laterality Date   COLONOSCOPY     CORONARY ARTERY BYPASS GRAFT N/A 01/07/2014   Procedure: CORONARY ARTERY BYPASS GRAFTING (CABG);  Surgeon: Grace Isaac, MD;  Location: [facility];  Service: Open Heart Surgery;  Laterality: N/A;  Coronary Artery Bypass Graft times two with internal mammary artery to Left Anterior Descending Coronary Artery and SVG to Distal Right Coronary Artery   FOOT SURGERY     right   REFRACTIVE SURGERY Bilateral 2021   TEE WITHOUT CARDIOVERSION N/A 01/07/2014   Procedure: TRANSESOPHAGEAL ECHOCARDIOGRAM (TEE);  Surgeon: Grace Isaac, MD;  Location: [place];  Service: Open Heart  Surgery;  Laterality: N/A;   Family History  Problem Relation Age of Onset   Diabetes Father    Diabetes Sister    Cancer Paternal Grandmother    Mental illness Sister     Social History   Tobacco Use   Smoking status: Every Day    Packs/day: 0.25    Years: 50.00    Pack years: 12.50    Types: Cigarettes   Smokeless tobacco: Never  Substance Use Topics   Alcohol use: No    Alcohol/week: 0.0 standard drinks   Marital Status: Married   ROS  Review of Systems  Constitutional: Negative for malaise/fatigue and weight gain.  Cardiovascular:  Positive for chest pain and dyspnea on exertion. Negative for claudication, leg swelling, near-syncope, orthopnea, palpitations, paroxysmal nocturnal dyspnea and syncope.  Neurological:  Negative for dizziness.   Objective  Blood pressure (!) 155/89, pulse 79, temperature 98.3 F (36.8 C), temperature source Temporal, resp. rate 17, height 5' 4"  (1.626 m), weight 161 lb (73 kg), SpO2 97 %.  Vitals with BMI 09/10/2020 09/10/2020 02/16/2014  Height - 5' 4"  5' 4"   Weight - 161 lbs 151 lbs 8 oz  BMI - 02.63 78.5  Systolic 885 027 741  Diastolic 89 87 74  Pulse 79 79 75      Physical Exam Vitals reviewed.  Cardiovascular:     Rate and Rhythm: Normal rate and regular rhythm.     Pulses: Intact distal pulses.  Carotid pulses are 2+ on the right side with bruit and 2+ on the left side with bruit.      Radial pulses are 2+ on the right side and 2+ on the left side.       Femoral pulses are 2+ on the right side and 2+ on the left side.      Popliteal pulses are 2+ on the right side and 2+ on the left side.       Dorsalis pedis pulses are 0 on the right side and 0 on the left side.       Posterior tibial pulses are 0 on the right side and 0 on the left side.     Heart sounds: S1 normal and S2 normal. No murmur heard.   No gallop.     Comments: Well-healed sternotomy scar Pulmonary:     Effort: Pulmonary effort is normal. No  respiratory distress.     Breath sounds: No wheezing, rhonchi or rales.  Abdominal:     Comments: Prominent abdominal pulsation  Musculoskeletal:     Right lower leg: No edema.     Left lower leg: No edema.  Neurological:     Mental Status: He is alert.    Laboratory examination:   No results for input(s): NA, K, CL, CO2, GLUCOSE, BUN, CREATININE, CALCIUM, GFRNONAA, GFRAA in the last 8760 hours. CrCl cannot be calculated (Patient's most recent lab result is older than the maximum 21 days allowed.).  CMP Latest Ref Rng & Units 01/10/2014 01/09/2014 01/08/2014  Glucose 70 - 99 mg/dL 111(H) 108(H) 134(H)  BUN 6 - 23 mg/dL 13 20 15   Creatinine 0.50 - 1.35 mg/dL 0.66 0.76 1.00  Sodium 137 - 147 mEq/L 135(L) 135(L) 135(L)  Potassium 3.7 - 5.3 mEq/L 4.0 4.5 4.3  Chloride 96 - 112 mEq/L 96 97 98  CO2 19 - 32 mEq/L 26 26 -  Calcium 8.4 - 10.5 mg/dL 8.6 8.7 -  Total Protein 6.0 - 8.3 g/dL - - -  Total Bilirubin 0.3 - 1.2 mg/dL - - -  Alkaline Phos 39 - 117 U/L - - -  AST 0 - 37 U/L - - -  ALT 0 - 53 U/L - - -   CBC Latest Ref Rng & Units 01/10/2014 01/09/2014 01/08/2014  WBC 4.0 - 10.5 K/uL 9.1 12.8(H) -  Hemoglobin 13.0 - 17.0 g/dL 10.4(L) 12.7(L) 13.3  Hematocrit 39.0 - 52.0 % 30.6(L) 36.9(L) 39.0  Platelets 150 - 400 K/uL 101(L) 117(L) -    Lipid Panel No results for input(s): CHOL, TRIG, LDLCALC, VLDL, HDL, CHOLHDL, LDLDIRECT in the last 8760 hours.  HEMOGLOBIN A1C Lab Results  Component Value Date   HGBA1C 6.0 (H) 01/05/2014   MPG 126 (H) 01/05/2014   TSH No results for input(s): TSH in the last 8760 hours.  External labs:  08/28/2020: Total cholesterol 224, HDL 45, triglycerides 125, LDL 154 Glucose 85, BUN 15, creatinine 0.92, GFR 95, sodium 137, potassium 5.1, AST 15, ALT 19, alk phos 94 Hgb 15.8, HCT 47.8, MCV 86.6, platelet 196 A1c 5.8% TSH 1.5  Allergies  No Known Allergies   Medications Prior to Visit:   Outpatient Medications Prior to Visit  Medication  Sig Dispense Refill   acetaminophen (TYLENOL) 500 MG tablet Take 1,000 mg by mouth every 6 (six) hours as needed.     albuterol (VENTOLIN HFA) 108 (90 Base) MCG/ACT inhaler Inhale 1 puff into the lungs daily.     ALPRAZolam (XANAX) 0.25 MG  tablet Take 1 tablet (0.25 mg total) by mouth 2 (two) times daily as needed for anxiety. 10 tablet 0   gabapentin (NEURONTIN) 300 MG capsule Take 300 mg by mouth 2 (two) times daily as needed.     hydrOXYzine (VISTARIL) 25 MG capsule Take 25 mg by mouth at bedtime.     MULTIPLE VITAMIN PO Take 1 tablet by mouth daily.     nitroGLYCERIN (NITROSTAT) 0.3 MG SL tablet Place 1 tablet under the tongue as needed.     aspirin EC 325 MG EC tablet Take 1 tablet (325 mg total) by mouth daily. 30 tablet 0   ALPRAZolam (XANAX) 0.25 MG tablet Take 1 tablet (0.25 mg total) by mouth 2 (two) times daily as needed for anxiety. 60 tablet 0   lisinopril (PRINIVIL,ZESTRIL) 5 MG tablet Take 1 tablet (5 mg total) by mouth daily. (Patient not taking: No sig reported) 30 tablet 1   metoprolol tartrate (LOPRESSOR) 25 MG tablet Take 0.5 tablets (12.5 mg total) by mouth 2 (two) times daily. 30 tablet 1   simvastatin (ZOCOR) 20 MG tablet Take 1 tablet (20 mg total) by mouth daily at 6 PM. 30 tablet 1   traMADol (ULTRAM) 50 MG tablet Take 1 tablet (50 mg total) by mouth every 6 (six) hours as needed. 40 tablet 0   No facility-administered medications prior to visit.   Final Medications at End of Visit    Current Meds  Medication Sig   acetaminophen (TYLENOL) 500 MG tablet Take 1,000 mg by mouth every 6 (six) hours as needed.   albuterol (VENTOLIN HFA) 108 (90 Base) MCG/ACT inhaler Inhale 1 puff into the lungs daily.   ALPRAZolam (XANAX) 0.25 MG tablet Take 1 tablet (0.25 mg total) by mouth 2 (two) times daily as needed for anxiety.   atorvastatin (LIPITOR) 40 MG tablet Take 1 tablet (40 mg total) by mouth daily.   gabapentin (NEURONTIN) 300 MG capsule Take 300 mg by mouth 2 (two) times  daily as needed.   hydrOXYzine (VISTARIL) 25 MG capsule Take 25 mg by mouth at bedtime.   metoprolol succinate (TOPROL-XL) 25 MG 24 hr tablet Take 1 tablet (25 mg total) by mouth daily. Take with or immediately following a meal.   MULTIPLE VITAMIN PO Take 1 tablet by mouth daily.   nicotine (NICODERM CQ - DOSED IN MG/24 HOURS) 21 mg/24hr patch Place 1 patch (21 mg total) onto the skin daily.   nitroGLYCERIN (NITROSTAT) 0.3 MG SL tablet Place 1 tablet under the tongue as needed.   valsartan (DIOVAN) 80 MG tablet Take 1 tablet (80 mg total) by mouth daily.   [DISCONTINUED] aspirin EC 325 MG EC tablet Take 1 tablet (325 mg total) by mouth daily.   Radiology:   No results found.  Cardiac Studies:   TEE 01/07/2014: - Left ventricle: Systolic function was mildly to moderately    reduced. The estimated ejection fraction was in the range of 40%    to 45%. Diffuse hypokinesis. Hypokinesis of the anteroseptal    myocardium. Hypokinesis of the inferoseptal myocardium.    Hypokinesis of the apical myocardium. Hypokinesis of the    inferoseptal myocardium. Hypokinesis of the anteroseptal    myocardium.  - Aortic valve: No evidence of vegetation.  - Right atrium: No evidence of thrombus in the atrial cavity or    appendage.  - Tricuspid valve: No evidence of vegetation.  - Pulmonic valve: No evidence of vegetation.  Cardiac catheterization 01/02/2014: Ostial LAD 100% stenosis Proximal  RCA 80% stenosis  CABG times 212/09/2013: CORONARY ARTERY BYPASS GRAFTING (CABG) x 2 (LIMA to LAD, SVG to RCA) with EVH from right thigh  EKG:   09/10/2020: Normal rhythm at a rate of 80 bpm.  Normal axis.  Anteroseptal infarct old.  Nonspecific T wave abnormality.  Assessment     ICD-10-CM   1. Coronary artery disease involving native coronary artery of native heart with unstable angina pectoris (HCC)  I25.110 EKG 12-Lead    valsartan (DIOVAN) 80 MG tablet    metoprolol succinate (TOPROL-XL) 25 MG 24 hr  tablet    atorvastatin (LIPITOR) 40 MG tablet    Brain natriuretic peptide    PCV MYOCARDIAL PERFUSION WITH LEXISCAN    2. Essential hypertension  I10 valsartan (DIOVAN) 80 MG tablet    metoprolol succinate (TOPROL-XL) 25 MG 24 hr tablet    Basic metabolic panel    3. Mixed hyperlipidemia  E78.2 atorvastatin (LIPITOR) 40 MG tablet    4. PAD (peripheral artery disease) (HCC)  I73.9 PCV ANKLE BRACHIAL INDEX (ABI)    5. Nicotine dependence, cigarettes, uncomplicated  T41.962 PCV AORTA DUPLEX    6. Hx of CABG- LIMA to LAD, SVG to RCA  Z95.1 PCV ECHOCARDIOGRAM COMPLETE    PCV MYOCARDIAL PERFUSION WITH LEXISCAN    7. Dyspnea on exertion  R06.00 Brain natriuretic peptide    PCV ECHOCARDIOGRAM COMPLETE    PCV MYOCARDIAL PERFUSION WITH LEXISCAN    8. Precordial pain  R07.2 PCV MYOCARDIAL PERFUSION WITH LEXISCAN    9. Bilateral carotid bruits  R09.89 PCV CAROTID DUPLEX (BILATERAL)       Medications Discontinued During This Encounter  Medication Reason   ALPRAZolam (XANAX) 0.25 MG tablet Error   lisinopril (PRINIVIL,ZESTRIL) 5 MG tablet Error   metoprolol tartrate (LOPRESSOR) 25 MG tablet Error   traMADol (ULTRAM) 50 MG tablet Error   simvastatin (ZOCOR) 20 MG tablet Error   aspirin EC 325 MG EC tablet     Meds ordered this encounter  Medications   valsartan (DIOVAN) 80 MG tablet    Sig: Take 1 tablet (80 mg total) by mouth daily.    Dispense:  30 tablet    Refill:  3   metoprolol succinate (TOPROL-XL) 25 MG 24 hr tablet    Sig: Take 1 tablet (25 mg total) by mouth daily. Take with or immediately following a meal.    Dispense:  30 tablet    Refill:  3   atorvastatin (LIPITOR) 40 MG tablet    Sig: Take 1 tablet (40 mg total) by mouth daily.    Dispense:  30 tablet    Refill:  3   nicotine (NICODERM CQ - DOSED IN MG/24 HOURS) 21 mg/24hr patch    Sig: Place 1 patch (21 mg total) onto the skin daily.    Dispense:  28 patch    Refill:  0   aspirin 81 MG EC tablet    Sig: Take  1 tablet (81 mg total) by mouth daily.    Recommendations:   TRAYDEN BRANDY is a 60 y.o. Caucasian male with history of MI with subsequent CABG x2 in 2015, hypertension, hyperlipidemia, active tobacco use (25 pack year history).  Denies family history of premature CAD, history of diabetes.   Patient was seen by cardiology in 2015, however he was unfortunately lost to follow-up until now.  Patient is now referred to our office for cardiovascular management given history of CAD.  Patient's blood pressure is uncontrolled at today's office  visit.  He has previously been on lisinopril, metoprolol, and simvastatin however he is not taking any of this.  He is also only taking aspirin occasionally.  We will start patient on aspirin 81 mg daily, atorvastatin 40 mg daily, metoprolol succinate 25 mg daily, and valsartan 80 mg daily.  We will repeat BMP in 1 week.  Discussed with patient at length regarding the importance of compliance with guideline directed medical therapy, he verbalized understanding and agreement.  Given patient's symptoms of chest pain and dyspnea on exertion will obtain echocardiogram and nuclear stress testing.  On exam patient has bilateral carotid bruit and prominent abdominal pulsation, as well as decreased pedal pulses.  We will obtain carotid duplex, abdominal aortic duplex, and ABI to further evaluate.  We will also obtain BNP given his symptoms of dyspnea on exertion.  Spent 5 minutes of today's office visit counseling patient regarding tobacco cessation.  Patient appears motivated to quit smoking.  Have prescribed nicotine replacement therapy to assist him.  Further recommendations after results of cardiac testing.  Patient was seen in collaboration with Dr. Einar Gip. He also reviewed patient's chart and examined the patient. Dr. Einar Gip is in agreement of the plan.   During this visit I reviewed and updated: Tobacco history  allergies medication reconciliation  medical history   surgical history  family history  social history.  This note was created using a voice recognition software as a result there may be grammatical errors inadvertently enclosed that do not reflect the nature of this encounter. Every attempt is made to correct such errors.   Alethia Berthold, PA-C 09/13/2020, 1:26 PM Office: [phone]

## 2020-09-13 MED ORDER — ASPIRIN 81 MG PO TBEC: 81.0000 mg | DELAYED_RELEASE_TABLET | Freq: Every day | ORAL | Status: DC

## 2020-09-14 ENCOUNTER — Telehealth: Payer: Self-pay

## 2020-09-14 NOTE — Telephone Encounter (Signed)
She can cut the medication Valsartan 80 mg to 1/2 tablet in the evening

## 2020-09-14 NOTE — Telephone Encounter (Signed)
Patients wife called and stated that she believe that the valsartan is causing the pts systolic bp to drop below 100. This morning it was 88/55.  She had him hold the valsartan and take the metoprolol. After holding it, it was 120/69. Pts wife is concerned that the does of the valsartan may be to high. Please advise.

## 2020-09-15 NOTE — Telephone Encounter (Signed)
Called and spoke to pt, pt is aware.

## 2020-10-08 ENCOUNTER — Other Ambulatory Visit: Payer: Self-pay

## 2020-10-08 ENCOUNTER — Ambulatory Visit: Payer: BC Managed Care – PPO

## 2020-10-08 DIAGNOSIS — R0989 Other specified symptoms and signs involving the circulatory and respiratory systems: Secondary | ICD-10-CM | POA: Diagnosis not present

## 2020-10-08 DIAGNOSIS — R0609 Other forms of dyspnea: Secondary | ICD-10-CM | POA: Diagnosis not present

## 2020-10-08 DIAGNOSIS — Z951 Presence of aortocoronary bypass graft: Secondary | ICD-10-CM

## 2020-10-08 DIAGNOSIS — R0600 Dyspnea, unspecified: Secondary | ICD-10-CM

## 2020-10-11 ENCOUNTER — Other Ambulatory Visit: Payer: Self-pay

## 2020-10-11 ENCOUNTER — Ambulatory Visit: Payer: BC Managed Care – PPO

## 2020-10-11 DIAGNOSIS — R072 Precordial pain: Secondary | ICD-10-CM

## 2020-10-11 DIAGNOSIS — I25110 Atherosclerotic heart disease of native coronary artery with unstable angina pectoris: Secondary | ICD-10-CM | POA: Diagnosis not present

## 2020-10-11 DIAGNOSIS — R0609 Other forms of dyspnea: Secondary | ICD-10-CM | POA: Diagnosis not present

## 2020-10-11 DIAGNOSIS — Z951 Presence of aortocoronary bypass graft: Secondary | ICD-10-CM | POA: Diagnosis not present

## 2020-10-11 DIAGNOSIS — R0600 Dyspnea, unspecified: Secondary | ICD-10-CM

## 2020-10-11 NOTE — Progress Notes (Signed)
Bilateral carotid artery stenosis, will follow-up in 1 year

## 2020-10-11 NOTE — Progress Notes (Signed)
Called and spoke with patient wife regarding his echo results.

## 2020-10-11 NOTE — Progress Notes (Signed)
Called and spoke with patient wife regarding his CAD results.

## 2020-10-13 ENCOUNTER — Telehealth: Payer: Self-pay

## 2020-10-13 NOTE — Telephone Encounter (Signed)
Spoke with patient wife and she would like a call about patients BP getting to low without him even taking BP medication. He is due for another dose in the morning and would like a call tonight to see if she needs to HOLD anything before he takes his medication at 6am, when he leaves for work, because she is worried that he may pass out at work, since the BP medication can make it drop even more.

## 2020-10-13 NOTE — Progress Notes (Signed)
Spoke with patients wife. She will give him the message.

## 2020-10-14 NOTE — Telephone Encounter (Signed)
Spoke with medical assistant yesterday who relayed following recommendations to patient and his wife: Hold all antihypertensive medications today.  Stop valsartan  Resumed metoprolol tomorrow at 12.5 mg

## 2020-10-22 ENCOUNTER — Other Ambulatory Visit: Payer: Self-pay

## 2020-10-22 ENCOUNTER — Ambulatory Visit: Payer: BC Managed Care – PPO

## 2020-10-22 DIAGNOSIS — I700 Atherosclerosis of aorta: Secondary | ICD-10-CM | POA: Diagnosis not present

## 2020-10-22 DIAGNOSIS — I714 Abdominal aortic aneurysm, without rupture, unspecified: Secondary | ICD-10-CM

## 2020-10-22 DIAGNOSIS — I739 Peripheral vascular disease, unspecified: Secondary | ICD-10-CM

## 2020-10-22 DIAGNOSIS — I25110 Atherosclerotic heart disease of native coronary artery with unstable angina pectoris: Secondary | ICD-10-CM | POA: Diagnosis not present

## 2020-10-22 DIAGNOSIS — R0600 Dyspnea, unspecified: Secondary | ICD-10-CM | POA: Diagnosis not present

## 2020-10-22 DIAGNOSIS — I10 Essential (primary) hypertension: Secondary | ICD-10-CM | POA: Diagnosis not present

## 2020-10-22 DIAGNOSIS — F17210 Nicotine dependence, cigarettes, uncomplicated: Secondary | ICD-10-CM

## 2020-10-23 LAB — BASIC METABOLIC PANEL
BUN/Creatinine Ratio: 13 (ref 10–24)
BUN: 13 mg/dL (ref 8–27)
CO2: 25 mmol/L (ref 20–29)
Calcium: 9.4 mg/dL (ref 8.6–10.2)
Chloride: 102 mmol/L (ref 96–106)
Creatinine, Ser: 0.97 mg/dL (ref 0.76–1.27)
Glucose: 107 mg/dL — ABNORMAL HIGH (ref 65–99)
Potassium: 6.1 mmol/L — ABNORMAL HIGH (ref 3.5–5.2)
Sodium: 141 mmol/L (ref 134–144)
eGFR: 89 mL/min/{1.73_m2} (ref >59)

## 2020-10-23 LAB — BRAIN NATRIURETIC PEPTIDE: BNP: 46.5 pg/mL (ref 0.0–100.0)

## 2020-10-25 NOTE — Progress Notes (Signed)
Will discuss at upcoming OV

## 2020-10-28 ENCOUNTER — Encounter: Payer: Self-pay | Admitting: Student

## 2020-10-28 ENCOUNTER — Other Ambulatory Visit: Payer: Self-pay

## 2020-10-28 ENCOUNTER — Ambulatory Visit: Payer: BC Managed Care – PPO | Admitting: Student

## 2020-10-28 ENCOUNTER — Ambulatory Visit: Payer: BC Managed Care – PPO | Admitting: Cardiology

## 2020-10-28 VITALS — BP 133/74 | HR 85 | Ht 64.0 in | Wt 164.0 lb

## 2020-10-28 DIAGNOSIS — I10 Essential (primary) hypertension: Secondary | ICD-10-CM

## 2020-10-28 DIAGNOSIS — I6523 Occlusion and stenosis of bilateral carotid arteries: Secondary | ICD-10-CM | POA: Diagnosis not present

## 2020-10-28 DIAGNOSIS — I25110 Atherosclerotic heart disease of native coronary artery with unstable angina pectoris: Secondary | ICD-10-CM | POA: Diagnosis not present

## 2020-10-28 DIAGNOSIS — E875 Hyperkalemia: Secondary | ICD-10-CM

## 2020-10-28 NOTE — Progress Notes (Signed)
Primary Physician/Referring:  Vincent Sailors, PA  [name], male    DOB: [birth year], 60 y.o.   MRN: [id number]  Chief Complaint  Patient presents with   Coronary Artery Disease   Follow-up   Results   HPI:    [name]  is a 60 y.o. Caucasian male with history of MI with subsequent CABG x2 in 2015, hypertension, hyperlipidemia, active tobacco use (25 pack year history).  Denies family history of premature CAD, history of diabetes.   Patient was seen by cardiology in 2015, however he was unfortunately lost to follow-up until now.  Patient was referred to our office for cardiovascular management given history of CAD.  Last office visit reinitiated beta-blocker, statin therapy, and aspirin as well as ARB.  However due to symptomatic hypotension and hyperkalemia patient has discontinued valsartan.  Since last office visit patient has cut back on smoking, presently smoking half a pack per day.  He has had no recurrence of chest discomfort and dyspnea on exertion has improved since last office visit.  Past Medical History:  Diagnosis Date   Anginal pain ([place])    Anxiety    Hypertension    Myocardial infarction [facility])    Neuropathy    Past Surgical History:  Procedure Laterality Date   COLONOSCOPY     CORONARY ARTERY BYPASS GRAFT N/A 01/07/2014   Procedure: CORONARY ARTERY BYPASS GRAFTING (CABG);  Surgeon: Vincent Isaac, MD;  Location: [place];  Service: Open Heart Surgery;  Laterality: N/A;  Coronary Artery Bypass Graft times two with internal mammary artery to Left Anterior Descending Coronary Artery and SVG to Distal Right Coronary Artery   FOOT SURGERY     right   REFRACTIVE SURGERY Bilateral 2021   TEE WITHOUT CARDIOVERSION N/A 01/07/2014   Procedure: TRANSESOPHAGEAL ECHOCARDIOGRAM (TEE);  Surgeon: Vincent Isaac, MD;  Location: [place];  Service: Open Heart Surgery;  Laterality: N/A;   Family History  Problem Relation Age of Onset   Diabetes Father     Diabetes Sister    Cancer Paternal Grandmother    Mental illness Sister     Social History   Tobacco Use   Smoking status: Every Day    Packs/day: 0.25    Years: 50.00    Pack years: 12.50    Types: Cigarettes   Smokeless tobacco: Never  Substance Use Topics   Alcohol use: No    Alcohol/week: 0.0 standard drinks   Marital Status: Married   ROS  Review of Systems  Constitutional: Negative for malaise/fatigue and weight gain.  Cardiovascular:  Positive for chest pain (resovled) and dyspnea on exertion (improving). Negative for claudication, leg swelling, near-syncope, orthopnea, palpitations, paroxysmal nocturnal dyspnea and syncope.  Neurological:  Negative for dizziness.   Objective  Blood pressure 133/74, pulse 85, height 5' 4" (1.626 m), weight 164 lb (74.4 kg), SpO2 96 %.  Vitals with BMI 10/28/2020 09/10/2020 09/10/2020  Height 5' 4" - 5' 4"  Weight 164 lbs - 161 lbs  BMI 38.75 - 64.33  Systolic 295 188 416  Diastolic 74 89 87  Pulse 85 79 79      Physical Exam Vitals reviewed.  Cardiovascular:     Rate and Rhythm: Normal rate and regular rhythm.     Pulses: Intact distal pulses.          Carotid pulses are 2+ on the right side with bruit and 2+ on the left side with bruit.  Radial pulses are 2+ on the right side and 2+ on the left side.       Femoral pulses are 2+ on the right side and 2+ on the left side.      Popliteal pulses are 2+ on the right side and 2+ on the left side.       Dorsalis pedis pulses are 0 on the right side and 0 on the left side.       Posterior tibial pulses are 0 on the right side and 0 on the left side.     Heart sounds: S1 normal and S2 normal. No murmur heard.   No gallop.     Comments: Well-healed sternotomy scar Pulmonary:     Effort: Pulmonary effort is normal. No respiratory distress.     Breath sounds: No wheezing, rhonchi or rales.  Abdominal:     Comments: Prominent abdominal pulsation  Musculoskeletal:     Right  lower leg: No edema.     Left lower leg: No edema.  Neurological:     Mental Status: He is alert.  Physical exam stable compared to previous.  Laboratory examination:   Recent Labs    10/22/20 0948  NA 141  K 6.1*  CL 102  CO2 25  GLUCOSE 107*  BUN 13  CREATININE 0.97  CALCIUM 9.4   estimated creatinine clearance is 73.9 mL/min (by C-G formula based on SCr of 0.97 mg/dL).  CMP Latest Ref Rng & Units 10/22/2020 01/10/2014 01/09/2014  Glucose 65 - 99 mg/dL 107(H) 111(H) 108(H)  BUN 8 - 27 mg/dL _0 Creatinine 0.76 - 1.27 mg/dL 0.97 0.66 0.76  Sodium 134 - 144 mmol/L 141 135(L) 135(L)  Potassium 3.5 - 5.2 mmol/L 6.1(H) 4.0 4.5  Chloride 96 - 106 mmol/L 102 96 97  CO2 20 - 29 mmol/L _1 Calcium 8.6 - 10.2 mg/dL 9.4 8.6 8.7  Total Protein 6.0 - 8.3 g/dL - - -  Total Bilirubin 0.3 - 1.2 mg/dL - - -  Alkaline Phos 39 - 117 U/L - - -  AST 0 - 37 U/L - - -  ALT 0 - 53 U/L - - -   CBC Latest Ref Rng & Units 01/10/2014 01/09/2014 01/08/2014  WBC 4.0 - 10.5 K/uL 9.1 12.8(H) -  Hemoglobin 13.0 - 17.0 g/dL 10.4(L) 12.7(L) 13.3  Hematocrit 39.0 - 52.0 % 30.6(L) 36.9(L) 39.0  Platelets 150 - 400 K/uL 101(L) 117(L) -    Lipid Panel No results for input(s): CHOL, TRIG, LDLCALC, VLDL, HDL, CHOLHDL, LDLDIRECT in the last 8760 hours.  HEMOGLOBIN A1C Lab Results  Component Value Date   HGBA1C 6.0 (H) 01/05/2014   MPG 126 (H) 01/05/2014   TSH No results for input(s): TSH in the last 8760 hours.  External labs:  08/28/2020: Total cholesterol 224, HDL 45, triglycerides 125, LDL 154 Glucose 85, BUN 15, creatinine 0.92, GFR 95, sodium 137, potassium 5.1, AST 15, ALT 19, alk phos 94 Hgb 15.8, HCT 47.8, MCV 86.6, platelet 196 A1c 5.8% TSH 1.5  Allergies  No Known Allergies   Medications Prior to Visit:   Outpatient Medications Prior to Visit  Medication Sig Dispense Refill   acetaminophen (TYLENOL) 500 MG tablet Take 1,000 mg by mouth every 6 (six) hours as needed.      albuterol (VENTOLIN HFA) 108 (90 Base) MCG/ACT inhaler Inhale 1 puff into the lungs daily.     aspirin 81 MG EC tablet Take 1 tablet (81 mg  total) by mouth daily.     atorvastatin (LIPITOR) 40 MG tablet Take 1 tablet (40 mg total) by mouth daily. 30 tablet 3   gabapentin (NEURONTIN) 300 MG capsule Take 300 mg by mouth 2 (two) times daily as needed.     hydrOXYzine (VISTARIL) 25 MG capsule Take 25 mg by mouth at bedtime.     metoprolol succinate (TOPROL-XL) 25 MG 24 hr tablet Take 1 tablet (25 mg total) by mouth daily. Take with or immediately following a meal. 30 tablet 3   MULTIPLE VITAMIN PO Take 1 tablet by mouth daily.     valsartan (DIOVAN) 80 MG tablet Take 1 tablet (80 mg total) by mouth daily. 30 tablet 3   nicotine (NICODERM CQ - DOSED IN MG/24 HOURS) 21 mg/24hr patch Place 1 patch (21 mg total) onto the skin daily. (Patient not taking: Reported on 10/28/2020) 28 patch 0   nitroGLYCERIN (NITROSTAT) 0.3 MG SL tablet Place 1 tablet under the tongue as needed.     No facility-administered medications prior to visit.   Final Medications at End of Visit    Current Meds  Medication Sig   acetaminophen (TYLENOL) 500 MG tablet Take 1,000 mg by mouth every 6 (six) hours as needed.   albuterol (VENTOLIN HFA) 108 (90 Base) MCG/ACT inhaler Inhale 1 puff into the lungs daily.   aspirin 81 MG EC tablet Take 1 tablet (81 mg total) by mouth daily.   atorvastatin (LIPITOR) 40 MG tablet Take 1 tablet (40 mg total) by mouth daily.   gabapentin (NEURONTIN) 300 MG capsule Take 300 mg by mouth 2 (two) times daily as needed.   hydrOXYzine (VISTARIL) 25 MG capsule Take 25 mg by mouth at bedtime.   metoprolol succinate (TOPROL-XL) 25 MG 24 hr tablet Take 1 tablet (25 mg total) by mouth daily. Take with or immediately following a meal.   MULTIPLE VITAMIN PO Take 1 tablet by mouth daily.   [DISCONTINUED] valsartan (DIOVAN) 80 MG tablet Take 1 tablet (80 mg total) by mouth daily.   Radiology:   No  results found.  Cardiac Studies:   TEE 01/07/2014: - Left ventricle: Systolic function was mildly to moderately    reduced. The estimated ejection fraction was in the range of 40%    to 45%. Diffuse hypokinesis. Hypokinesis of the anteroseptal    myocardium. Hypokinesis of the inferoseptal myocardium.    Hypokinesis of the apical myocardium. Hypokinesis of the    inferoseptal myocardium. Hypokinesis of the anteroseptal    myocardium.  - Aortic valve: No evidence of vegetation.  - Right atrium: No evidence of thrombus in the atrial cavity or    appendage.  - Tricuspid valve: No evidence of vegetation.  - Pulmonic valve: No evidence of vegetation.  Cardiac catheterization 01/02/2014: Ostial LAD 100% stenosis Proximal RCA 80% stenosis  CABG times 01/07/2014: CORONARY ARTERY BYPASS GRAFTING (CABG) x 2 (LIMA to LAD, SVG to RCA) with EVH from right thigh  PCV ECHOCARDIOGRAM COMPLETE 10/08/2020 Study Quality: Technically difficult study. Mildly depressed LV systolic function with visual EF 45-50%. Left ventricle cavity is normal in size. Mild left ventricular hypertrophy. Normal global wall motion. Abnormal septal wall motion due to post-operative coronary artery bypass graft. Doppler evidence of grade I (impaired) diastolic dysfunction, normal LAP. Trace aortic regurgitation. Mild tricuspid regurgitation. No evidence of pulmonary hypertension. The aortic root is mildly dilated (Sinus of Valsalva 33m) . Proximal ascending aorta not well visualized. No prior study for comparison.   Carotid artery duplex  10/08/2020:  Duplex suggests stenosis in the right internal carotid artery (16-49%).  Duplex suggests stenosis in the right common carotid artery (<50%). There  is diffuse soft plaque through out the right carotid artery, Duplex  suggests stenosis in the right external carotid artery (<50%).  Duplex suggests stenosis in the left internal carotid artery (16-49%).  Duplex suggests stenosis  in the left common carotid artery (<50%). There  is diffuse soft plaque through out the left carotid artery. Duplex  suggests stenosis in the left external carotid artery (<50%).  Antegrade right vertebral artery flow. Antegrade left vertebral artery flow.  Follow up in one year is appropriate if clinically indicated.  PCV MYOCARDIAL PERFUSION WITH LEXISCAN 10/11/2020 Lexiscan nuclear stress test performed using 1-day protocol. The baseline blood pressure was 110/90 mmHg. Maximum blood pressure post injection was 100/60 mmHg. After injection the patient developed no chest pain, and experienced dizziness and dyspnea, and stomach pain. SPECT images show small sized, mild intensity, fixed perfusion defect in apical inferior myocardium with associated mild hypokinesis. Stress LVEF 41%. High risk study due to low stress LVEF.  Abdominal Aortic Duplex 10/22/2020:  Moderate dilatation of the abdominal aorta is noted in the mid aorta. An  abdominal aortic aneurysm measuring 3.07 x 3.01 x 3.05 cm is seen.  Moderate mixed plaque noted in the proximal, mid and distal aorta.  Normal common iliac artery velocity.  Recheck in 3 years for stability.   ABI 10/22/2020:  This exam reveals mildly decreased perfusion of the right lower extremity,  noted at the post tibial artery level (ABI 0.90) and moderately decreased  perfusion of the left lower extremity, noted at the anterior tibial and  post tibial artery level (ABI 0.76).  There is mildly abnormal biphasic waveform pattern at the right ankle and  normal triphasic waveform pattern on the left ankle.  Clinical correlation  recommended.  EKG:   09/10/2020: Normal rhythm at a rate of 80 bpm.  Normal axis.  Anteroseptal infarct old.  Nonspecific T wave abnormality.  Assessment     ICD-10-CM   1. Hyperkalemia  P32.9 Basic metabolic panel    2. Bilateral carotid artery stenosis  I65.23 PCV CAROTID DUPLEX (BILATERAL)    3. Coronary artery disease  involving native coronary artery of native heart with unstable angina pectoris ([place])  I25.110     4. Essential hypertension  I10        Medications Discontinued During This Encounter  Medication Reason   valsartan (DIOVAN) 80 MG tablet Error    No orders of the defined types were placed in this encounter.   Recommendations:   [name] is a 60 y.o. Caucasian male with history of MI with subsequent CABG x2 in 2015, hypertension, hyperlipidemia, active tobacco use (25 pack year history).  Denies family history of premature CAD, history of diabetes.   Patient was seen by cardiology in 2015, however he was unfortunately lost to follow-up until now.  Patient is referred to our office for cardiovascular management given history of CAD.  Patient brings with him a written blood pressure log, which reveals blood pressure is well controlled at home.  He was unable to tolerate valsartan due to symptomatic hypotension as well as hyperkalemia.  Patient has not taken valsartan since 10/13/2020, will therefore repeat BMP to reevaluate hyperkalemia.  Patient is otherwise tolerating beta-blocker therapy, aspirin, and statin therapy.  We will plan to repeat lipid profile testing following next office visit.  Patient's symptoms have significantly improved since  last office visit.  Reviewed and discussed with patient results of cardiovascular testing, details above.  We will plan to repeat carotid artery duplex in 1 year and abdominal aortic duplex in 3 years.  Counseled patient regarding graduated exercise in regards to PAD.  Patient is presently asymptomatic without ulcers or symptoms of claudication.  Echocardiogram revealed mildly reduced LVEF and stress test showed no reversible ischemia.  We will continue present medical therapy, and consider rechallenge of ACE inhibitor or ARB in the future if able per renal function and hemodynamics.  Encourage patient to continue to focus on diet and lifestyle  modifications, particularly smoking cessation.  Spent 5 minutes of today's visit counseling patient regarding tobacco cessation.  He is congratulated on his efforts thus far.  Follow-up in 3 months, sooner if needed.   Alethia Berthold, PA-C 10/28/2020, 4:53 PM Office: [phone]

## 2020-10-29 LAB — BASIC METABOLIC PANEL
BUN/Creatinine Ratio: 16 (ref 10–24)
BUN: 16 mg/dL (ref 8–27)
CO2: 17 mmol/L — ABNORMAL LOW (ref 20–29)
Calcium: 9 mg/dL (ref 8.6–10.2)
Chloride: 103 mmol/L (ref 96–106)
Creatinine, Ser: 0.98 mg/dL (ref 0.76–1.27)
Glucose: 92 mg/dL (ref 70–99)
Potassium: 4.5 mmol/L (ref 3.5–5.2)
Sodium: 138 mmol/L (ref 134–144)
eGFR: 88 mL/min/{1.73_m2} (ref >59)

## 2020-11-01 NOTE — Progress Notes (Signed)
Potassium has normalized since discontinuing valsartan.

## 2020-11-02 NOTE — Progress Notes (Signed)
Called and spoke to pt, pt voiced understanding.

## 2020-12-27 ENCOUNTER — Other Ambulatory Visit: Payer: Self-pay | Admitting: Student

## 2020-12-27 DIAGNOSIS — I10 Essential (primary) hypertension: Secondary | ICD-10-CM

## 2020-12-27 DIAGNOSIS — I25110 Atherosclerotic heart disease of native coronary artery with unstable angina pectoris: Secondary | ICD-10-CM

## 2021-01-04 ENCOUNTER — Other Ambulatory Visit: Payer: Self-pay | Admitting: Student

## 2021-01-04 DIAGNOSIS — E782 Mixed hyperlipidemia: Secondary | ICD-10-CM

## 2021-01-04 DIAGNOSIS — I25110 Atherosclerotic heart disease of native coronary artery with unstable angina pectoris: Secondary | ICD-10-CM

## 2021-02-04 ENCOUNTER — Other Ambulatory Visit: Payer: Self-pay

## 2021-02-04 ENCOUNTER — Ambulatory Visit: Payer: BC Managed Care – PPO | Admitting: Student

## 2021-02-04 ENCOUNTER — Encounter: Payer: Self-pay | Admitting: Student

## 2021-02-04 VITALS — BP 131/75 | HR 74 | Temp 98.3°F | Resp 16 | Ht 64.0 in | Wt 167.4 lb

## 2021-02-04 DIAGNOSIS — E782 Mixed hyperlipidemia: Secondary | ICD-10-CM | POA: Diagnosis not present

## 2021-02-04 DIAGNOSIS — I10 Essential (primary) hypertension: Secondary | ICD-10-CM | POA: Diagnosis not present

## 2021-02-04 NOTE — Progress Notes (Signed)
Primary Physician/Referring:  Trey Sailors, PA  Patient ID: Lujean Amel, male    DOB: [birth year], 61 y.o.   MRN: [id number]  Chief Complaint  Patient presents with   Coronary Artery Disease   Hypertension   Hyperlipidemia   Follow-up    3 months   HPI:    VERNARD GRAM  is a 61 y.o. Caucasian male with history of MI with subsequent CABG x2 in 2015, hypertension, hyperlipidemia, active tobacco use (25 pack year history).  Denies family history of premature CAD, history of diabetes.   Patient was seen by cardiology in 2015, however he was unfortunately lost to and was subsequently referred to our office for cardiovascular management given history of CAD.    Patient presents for 63-monthfollow-up.  He is doing well without specific complaints.  He does unfortunately continue to smoke about a pack per day.  Denies chest pain, palpitations, dyspnea, dizziness.  Denies orthopnea, PND, leg edema.  Patient states he is not ready to quit smoking at this time.  Testing has normalized since discontinuing valsartan.   Past Medical History:  Diagnosis Date   [place]    Anxiety    Hyperlipidemia    Hypertension    Myocardial infarction ([facility]    Neuropathy    Past Surgical History:  Procedure Laterality Date   COLONOSCOPY     CORONARY ARTERY BYPASS GRAFT N/A 01/07/2014   Procedure: CORONARY ARTERY BYPASS GRAFTING (CABG);  Surgeon: EGrace Isaac MD;  [facility]  Service: Open Heart Surgery;  Laterality: N/A;  Coronary Artery Bypass Graft times two with internal mammary artery to Left Anterior Descending Coronary Artery and SVG to Distal Right Coronary Artery   FOOT SURGERY     right   REFRACTIVE SURGERY Bilateral 2021   TEE WITHOUT CARDIOVERSION N/A 01/07/2014   Procedure: TRANSESOPHAGEAL ECHOCARDIOGRAM (TEE);  Surgeon: EGrace Isaac MD;  [facility] [facility]  Service: Open Heart Surgery;  Laterality: N/A;   Family History  Problem Relation Age of Onset    Diabetes Father    Diabetes Sister    Cancer Paternal Grandmother    Mental illness Sister     Social History   Tobacco Use   Smoking status: Every Day    Packs/day: 0.25    Years: 50.00    Pack years: 12.50    Types: Cigarettes   Smokeless tobacco: Never  Substance Use Topics   Alcohol use: No    Alcohol/week: 0.0 standard drinks   Marital Status: Married   ROS  Review of Systems  Cardiovascular:  Negative for chest pain, claudication, leg swelling, near-syncope, orthopnea, palpitations, paroxysmal nocturnal dyspnea and syncope.  Respiratory:  Negative for shortness of breath.   Neurological:  Negative for dizziness.   Objective  Blood pressure 131/75, pulse 74, temperature 98.3 F (36.8 C), temperature source Temporal, resp. rate 16, height 5' 4"  (1.626 m), weight 167 lb 6.4 oz (75.9 kg), SpO2 97 %.  Vitals with BMI 02/04/2021 10/28/2020 09/10/2020  Height 5' 4"  5' 4"  -  Weight 167 lbs 6 oz 164 lbs -  BMI 279.48201.65-  Systolic [id number] Diastolic 75 74 89  Pulse 74 85 79      Physical Exam Vitals reviewed.  Cardiovascular:     Rate and Rhythm: Normal rate and regular rhythm.     Pulses: Intact distal pulses.          Carotid pulses are 2+ on the  right side with bruit and 2+ on the left side with bruit.      Radial pulses are 2+ on the right side and 2+ on the left side.       Femoral pulses are 2+ on the right side and 2+ on the left side.      Popliteal pulses are 2+ on the right side and 2+ on the left side.       Dorsalis pedis pulses are 0 on the right side and 0 on the left side.       Posterior tibial pulses are 0 on the right side and 0 on the left side.     Heart sounds: S1 normal and S2 normal. No murmur heard.   No gallop.     Comments: Well-healed sternotomy scar Pulmonary:     Effort: Pulmonary effort is normal. No respiratory distress.     Breath sounds: No wheezing, rhonchi or rales.  Abdominal:     Comments: Prominent abdominal pulsation   Musculoskeletal:     Right lower leg: No edema.     Left lower leg: No edema.  Neurological:     Mental Status: He is alert.  Physical exam stable compared to previous.  Laboratory examination:   Recent Labs    10/22/20 0948 10/28/20 1542  NA 141 138  K 6.1* 4.5  CL 102 103  CO2 25 17*  GLUCOSE 107* 92  BUN 13 16  CREATININE 0.97 0.98  CALCIUM 9.4 9.0   CrCl cannot be calculated (Patient's most recent lab result is older than the maximum 21 days allowed.).  CMP Latest Ref Rng & Units 10/28/2020 10/22/2020 01/10/2014  Glucose 70 - 99 mg/dL 92 107(H) 111(H)  BUN 8 - 27 mg/dL 16 13 13   Creatinine 0.76 - 1.27 mg/dL 0.98 0.97 0.66  Sodium 134 - 144 mmol/L 138 141 135(L)  Potassium 3.5 - 5.2 mmol/L 4.5 6.1(H) 4.0  Chloride 96 - 106 mmol/L 103 102 96  CO2 20 - 29 mmol/L 17(L) 25 26  Calcium 8.6 - 10.2 mg/dL 9.0 9.4 8.6  Total Protein 6.0 - 8.3 g/dL - - -  Total Bilirubin 0.3 - 1.2 mg/dL - - -  Alkaline Phos 39 - 117 U/L - - -  AST 0 - 37 U/L - - -  ALT 0 - 53 U/L - - -   CBC Latest Ref Rng & Units 01/10/2014 01/09/2014 01/08/2014  WBC 4.0 - 10.5 K/uL 9.1 12.8(H) -  Hemoglobin 13.0 - 17.0 g/dL 10.4(L) 12.7(L) 13.3  Hematocrit 39.0 - 52.0 % 30.6(L) 36.9(L) 39.0  Platelets 150 - 400 K/uL 101(L) 117(L) -    Lipid Panel No results for input(s): CHOL, TRIG, LDLCALC, VLDL, HDL, CHOLHDL, LDLDIRECT in the last 8760 hours.  HEMOGLOBIN A1C Lab Results  Component Value Date   HGBA1C 6.0 (H) 01/05/2014   MPG 126 (H) 01/05/2014   TSH No results for input(s): TSH in the last 8760 hours.  External labs:  08/28/2020: Total cholesterol 224, HDL 45, triglycerides 125, LDL 154 Glucose 85, BUN 15, creatinine 0.92, GFR 95, sodium 137, potassium 5.1, AST 15, ALT 19, alk phos 94 Hgb 15.8, HCT 47.8, MCV 86.6, platelet 196 A1c 5.8% TSH 1.5  Allergies  No Known Allergies   Medications Prior to Visit:   Outpatient Medications Prior to Visit  Medication Sig Dispense Refill    acetaminophen (TYLENOL) 500 MG tablet Take 1,000 mg by mouth every 6 (six) hours as needed.  albuterol (VENTOLIN HFA) 108 (90 Base) MCG/ACT inhaler Inhale 1 puff into the lungs daily.     aspirin 81 MG EC tablet Take 1 tablet (81 mg total) by mouth daily.     atorvastatin (LIPITOR) 40 MG tablet TAKE 1 TABLET BY MOUTH DAILY. 90 tablet 3   hydrOXYzine (VISTARIL) 25 MG capsule Take 25 mg by mouth at bedtime.     metoprolol succinate (TOPROL-XL) 25 MG 24 hr tablet TAKE 1 TABLET BY MOUTH DAILY. TAKE WITH OR IMMEDIATELY FOLLOWING A MEAL. 90 tablet 3   nitroGLYCERIN (NITROSTAT) 0.3 MG SL tablet Place 1 tablet under the tongue as needed.     gabapentin (NEURONTIN) 300 MG capsule Take 300 mg by mouth 2 (two) times daily as needed.     MULTIPLE VITAMIN PO Take 1 tablet by mouth daily.     nicotine (NICODERM CQ - DOSED IN MG/24 HOURS) 21 mg/24hr patch Place 1 patch (21 mg total) onto the skin daily. (Patient not taking: Reported on 10/28/2020) 28 patch 0   No facility-administered medications prior to visit.   Final Medications at End of Visit    Current Meds  Medication Sig   acetaminophen (TYLENOL) 500 MG tablet Take 1,000 mg by mouth every 6 (six) hours as needed.   albuterol (VENTOLIN HFA) 108 (90 Base) MCG/ACT inhaler Inhale 1 puff into the lungs daily.   aspirin 81 MG EC tablet Take 1 tablet (81 mg total) by mouth daily.   atorvastatin (LIPITOR) 40 MG tablet TAKE 1 TABLET BY MOUTH DAILY.   hydrOXYzine (VISTARIL) 25 MG capsule Take 25 mg by mouth at bedtime.   metoprolol succinate (TOPROL-XL) 25 MG 24 hr tablet TAKE 1 TABLET BY MOUTH DAILY. TAKE WITH OR IMMEDIATELY FOLLOWING A MEAL.   nitroGLYCERIN (NITROSTAT) 0.3 MG SL tablet Place 1 tablet under the tongue as needed.   Radiology:   No results found.  Cardiac Studies:   TEE 01/07/2014: - Left ventricle: Systolic function was mildly to moderately    reduced. The estimated ejection fraction was in the range of 40%    to 45%. Diffuse  hypokinesis. Hypokinesis of the anteroseptal    myocardium. Hypokinesis of the inferoseptal myocardium.    Hypokinesis of the apical myocardium. Hypokinesis of the    inferoseptal myocardium. Hypokinesis of the anteroseptal    myocardium.  - Aortic valve: No evidence of vegetation.  - Right atrium: No evidence of thrombus in the atrial cavity or    appendage.  - Tricuspid valve: No evidence of vegetation.  - Pulmonic valve: No evidence of vegetation.  Cardiac catheterization 01/02/2014: Ostial LAD 100% stenosis Proximal RCA 80% stenosis  CABG times 01/07/2014: CORONARY ARTERY BYPASS GRAFTING (CABG) x 2 (LIMA to LAD, SVG to RCA) with EVH from right thigh  PCV ECHOCARDIOGRAM COMPLETE 10/08/2020 Study Quality: Technically difficult study. Mildly depressed LV systolic function with visual EF 45-50%. Left ventricle cavity is normal in size. Mild left ventricular hypertrophy. Normal global wall motion. Abnormal septal wall motion due to post-operative coronary artery bypass graft. Doppler evidence of grade I (impaired) diastolic dysfunction, normal LAP. Trace aortic regurgitation. Mild tricuspid regurgitation. No evidence of pulmonary hypertension. The aortic root is mildly dilated (Sinus of Valsalva 7m) . Proximal ascending aorta not well visualized. No prior study for comparison.   Carotid artery duplex 10/08/2020:  Duplex suggests stenosis in the right internal carotid artery (16-49%).  Duplex suggests stenosis in the right common carotid artery (<50%). There  is diffuse soft plaque through out the  right carotid artery, Duplex  suggests stenosis in the right external carotid artery (<50%).  Duplex suggests stenosis in the left internal carotid artery (16-49%).  Duplex suggests stenosis in the left common carotid artery (<50%). There  is diffuse soft plaque through out the left carotid artery. Duplex  suggests stenosis in the left external carotid artery (<50%).  Antegrade right  vertebral artery flow. Antegrade left vertebral artery flow.  Follow up in one year is appropriate if clinically indicated.  PCV MYOCARDIAL PERFUSION WITH LEXISCAN 10/11/2020 Lexiscan nuclear stress test performed using 1-day protocol. The baseline blood pressure was 110/90 mmHg. Maximum blood pressure post injection was 100/60 mmHg. After injection the patient developed no chest pain, and experienced dizziness and dyspnea, and stomach pain. SPECT images show small sized, mild intensity, fixed perfusion defect in apical inferior myocardium with associated mild hypokinesis. Stress LVEF 41%. High risk study due to low stress LVEF.  Abdominal Aortic Duplex 10/22/2020:  Moderate dilatation of the abdominal aorta is noted in the mid aorta. An  abdominal aortic aneurysm measuring 3.07 x 3.01 x 3.05 cm is seen.  Moderate mixed plaque noted in the proximal, mid and distal aorta.  Normal common iliac artery velocity.  Recheck in 3 years for stability.   ABI 10/22/2020:  This exam reveals mildly decreased perfusion of the right lower extremity,  noted at the post tibial artery level (ABI 0.90) and moderately decreased  perfusion of the left lower extremity, noted at the anterior tibial and  post tibial artery level (ABI 0.76).  There is mildly abnormal biphasic waveform pattern at the right ankle and  normal triphasic waveform pattern on the left ankle.  Clinical correlation  recommended.  EKG:   09/10/2020: Normal rhythm at a rate of 80 bpm.  Normal axis.  Anteroseptal infarct old.  Nonspecific T wave abnormality.  Assessment     ICD-10-CM   1. Essential hypertension  H29 Basic metabolic panel    2. Mixed hyperlipidemia  E78.2 Lipid Panel With LDL/HDL Ratio       Medications Discontinued During This Encounter  Medication Reason   gabapentin (NEURONTIN) 300 MG capsule    MULTIPLE VITAMIN PO    nicotine (NICODERM CQ - DOSED IN MG/24 HOURS) 21 mg/24hr patch     No orders of the defined  types were placed in this encounter.    Recommendations:   MURRELL DOME is a 61 y.o. Caucasian male with history of MI with subsequent CABG x2 in 2015, hypertension, hyperlipidemia, active tobacco use (25 pack year history).  Denies family history of premature CAD, history of diabetes.   Patient was seen by cardiology in 2015, however he was unfortunately and subsequently referred to our office for cardiovascular management given history of CAD.    Patient presents for 78-monthfollow-up he is presently asymptomatic.  He is tolerating beta-blocker therapy, aspirin, and statin, will continue this.  We will repeat lipid profile testing.  We will also obtain repeat BMP.  Could consider rechallenge and low-dose of ACE inhibitor or ARB if renal function is stable.  Spent about 5 minutes of today's office visit counseling patient regarding tobacco cessation. He is not willing to quit at this time.  In regard to PAD patient is presently asymptomatic without ulcers or symptoms of claudication.  He is due for repeat carotid artery duplex in September 2023.  We will plan to follow-up after that.  Follow-up in September, sooner if needed, for CAD.   CAlethia Berthold PA-C 02/04/2021,  2:11 PM Office: [phone]

## 2021-02-14 DIAGNOSIS — E782 Mixed hyperlipidemia: Secondary | ICD-10-CM | POA: Diagnosis not present

## 2021-02-14 DIAGNOSIS — I10 Essential (primary) hypertension: Secondary | ICD-10-CM | POA: Diagnosis not present

## 2021-02-15 LAB — BASIC METABOLIC PANEL
BUN/Creatinine Ratio: 15 (ref 10–24)
BUN: 14 mg/dL (ref 8–27)
CO2: 22 mmol/L (ref 20–29)
Calcium: 9.2 mg/dL (ref 8.6–10.2)
Chloride: 104 mmol/L (ref 96–106)
Creatinine, Ser: 0.94 mg/dL (ref 0.76–1.27)
Glucose: 116 mg/dL — ABNORMAL HIGH (ref 70–99)
Potassium: 6 mmol/L — ABNORMAL HIGH (ref 3.5–5.2)
Sodium: 138 mmol/L (ref 134–144)
eGFR: 92 mL/min/{1.73_m2} (ref >59)

## 2021-02-15 LAB — LIPID PANEL WITH LDL/HDL RATIO
Cholesterol, Total: 139 mg/dL (ref 100–199)
HDL: 48 mg/dL (ref >39)
LDL Chol Calc (NIH): 82 mg/dL (ref 0–99)
LDL/HDL Ratio: 1.7 ratio (ref 0.0–3.6)
Triglycerides: 38 mg/dL (ref 0–149)
VLDL Cholesterol Cal: 9 mg/dL (ref 5–40)

## 2021-02-16 ENCOUNTER — Other Ambulatory Visit: Payer: Self-pay

## 2021-02-16 DIAGNOSIS — I10 Essential (primary) hypertension: Secondary | ICD-10-CM

## 2021-02-16 MED ORDER — HYDROCHLOROTHIAZIDE 12.5 MG PO TABS: 12.5000 mg | ORAL_TABLET | Freq: Every day | ORAL | 1 refills | Status: DC

## 2021-02-16 MED ORDER — EZETIMIBE 10 MG PO TABS: 10.0000 mg | ORAL_TABLET | Freq: Every day | ORAL | 1 refills | Status: DC

## 2021-02-16 NOTE — Progress Notes (Signed)
Patient is aware he agreed on starting medications. Medications have been sent and BMP has been ordered.

## 2021-02-16 NOTE — Progress Notes (Signed)
Tried calling patient no answer left a vm

## 2021-04-04 DIAGNOSIS — I25708 Atherosclerosis of coronary artery bypass graft(s), unspecified, with other forms of angina pectoris: Secondary | ICD-10-CM | POA: Diagnosis not present

## 2021-04-04 DIAGNOSIS — R0602 Shortness of breath: Secondary | ICD-10-CM | POA: Diagnosis not present

## 2021-04-04 DIAGNOSIS — R7303 Prediabetes: Secondary | ICD-10-CM | POA: Diagnosis not present

## 2021-04-04 DIAGNOSIS — Z0000 Encounter for general adult medical examination without abnormal findings: Secondary | ICD-10-CM | POA: Diagnosis not present

## 2021-04-04 DIAGNOSIS — F419 Anxiety disorder, unspecified: Secondary | ICD-10-CM | POA: Diagnosis not present

## 2021-04-12 DIAGNOSIS — R7303 Prediabetes: Secondary | ICD-10-CM | POA: Diagnosis not present

## 2021-04-12 DIAGNOSIS — Z720 Tobacco use: Secondary | ICD-10-CM | POA: Diagnosis not present

## 2021-04-12 DIAGNOSIS — E7800 Pure hypercholesterolemia, unspecified: Secondary | ICD-10-CM | POA: Diagnosis not present

## 2021-04-12 DIAGNOSIS — I25708 Atherosclerosis of coronary artery bypass graft(s), unspecified, with other forms of angina pectoris: Secondary | ICD-10-CM | POA: Diagnosis not present

## 2021-04-22 ENCOUNTER — Other Ambulatory Visit: Payer: Self-pay | Admitting: Student

## 2021-06-24 ENCOUNTER — Other Ambulatory Visit: Payer: Self-pay | Admitting: Student

## 2021-08-31 ENCOUNTER — Other Ambulatory Visit: Payer: Self-pay | Admitting: Student

## 2021-09-23 ENCOUNTER — Other Ambulatory Visit: Payer: BC Managed Care – PPO

## 2021-10-07 ENCOUNTER — Ambulatory Visit: Payer: Self-pay | Admitting: Internal Medicine

## 2021-10-11 ENCOUNTER — Ambulatory Visit: Payer: Self-pay | Admitting: Internal Medicine

## 2021-10-25 ENCOUNTER — Other Ambulatory Visit: Payer: Self-pay

## 2021-11-01 ENCOUNTER — Ambulatory Visit: Payer: Self-pay | Admitting: Internal Medicine

## 2022-10-12 NOTE — Telephone Encounter (Signed)
This encounter was created in error - please disregard.

## 2023-03-05 ENCOUNTER — Emergency Department: Payer: No Typology Code available for payment source

## 2023-03-05 ENCOUNTER — Emergency Department
Admission: EM | Admit: 2023-03-05 | Discharge: 2023-03-05 | Disposition: A | Discharge status: Home / Self Care | Payer: No Typology Code available for payment source | Attending: Emergency Medicine | Admitting: Emergency Medicine

## 2023-03-05 ENCOUNTER — Other Ambulatory Visit: Payer: Self-pay

## 2023-03-05 DIAGNOSIS — I2089 Other forms of angina pectoris: Secondary | ICD-10-CM | POA: Insufficient documentation

## 2023-03-05 DIAGNOSIS — R0609 Other forms of dyspnea: Secondary | ICD-10-CM

## 2023-03-05 DIAGNOSIS — Z951 Presence of aortocoronary bypass graft: Secondary | ICD-10-CM | POA: Insufficient documentation

## 2023-03-05 DIAGNOSIS — F17200 Nicotine dependence, unspecified, uncomplicated: Secondary | ICD-10-CM | POA: Insufficient documentation

## 2023-03-05 DIAGNOSIS — R079 Chest pain, unspecified: Secondary | ICD-10-CM | POA: Diagnosis present

## 2023-03-05 DIAGNOSIS — I25110 Atherosclerotic heart disease of native coronary artery with unstable angina pectoris: Secondary | ICD-10-CM

## 2023-03-05 DIAGNOSIS — R0789 Other chest pain: Secondary | ICD-10-CM

## 2023-03-05 DIAGNOSIS — I10 Essential (primary) hypertension: Secondary | ICD-10-CM | POA: Insufficient documentation

## 2023-03-05 LAB — BASIC METABOLIC PANEL
Anion gap: 12 (ref 5–15)
BUN: 15 mg/dL (ref 8–23)
CO2: 22 mmol/L (ref 22–32)
Calcium: 8.9 mg/dL (ref 8.9–10.3)
Chloride: 104 mmol/L (ref 98–111)
Creatinine, Ser: 1.16 mg/dL (ref 0.61–1.24)
GFR, Estimated: >60 mL/min (ref >60)
Glucose, Bld: 111 mg/dL — ABNORMAL HIGH (ref 70–99)
Potassium: 4.1 mmol/L (ref 3.5–5.1)
Sodium: 138 mmol/L (ref 135–145)

## 2023-03-05 LAB — CBC
HCT: 46 % (ref 39.0–52.0)
Hemoglobin: 15.8 g/dL (ref 13.0–17.0)
MCH: 29.3 pg (ref 26.0–34.0)
MCHC: 34.3 g/dL (ref 30.0–36.0)
MCV: 85.3 fL (ref 80.0–100.0)
Platelets: 158 10*3/uL (ref 150–400)
RBC: 5.39 MIL/uL (ref 4.22–5.81)
RDW: 13.8 % (ref 11.5–15.5)
WBC: 6.6 10*3/uL (ref 4.0–10.5)
nRBC: 0 % (ref 0.0–0.2)

## 2023-03-05 LAB — TROPONIN I (HIGH SENSITIVITY): Troponin I (High Sensitivity): 5 ng/L (ref <18)

## 2023-03-05 LAB — BRAIN NATRIURETIC PEPTIDE: B Natriuretic Peptide: 58.2 pg/mL (ref 0.0–100.0)

## 2023-03-05 MED ORDER — EZETIMIBE 10 MG PO TABS: 10.0000 mg | ORAL_TABLET | Freq: Every day | ORAL | 1 refills | Status: DC

## 2023-03-05 MED ORDER — ALBUTEROL SULFATE HFA 108 (90 BASE) MCG/ACT IN AERS
2.0000 | INHALATION_SPRAY | Freq: Four times a day (QID) | RESPIRATORY_TRACT | 2 refills | Status: AC | PRN
Start: 2023-03-05 — End: ?

## 2023-03-05 MED ORDER — HYDROCHLOROTHIAZIDE 25 MG PO TABS: 25.0000 mg | ORAL_TABLET | Freq: Every day | ORAL | 1 refills | Status: DC

## 2023-03-05 MED ORDER — METOPROLOL SUCCINATE ER 25 MG PO TB24: 25.0000 mg | ORAL_TABLET | Freq: Every day | ORAL | 1 refills | Status: DC

## 2023-03-05 NOTE — ED Notes (Signed)
See triage note  Presents with some SOB and chest discomfort  States she noticed this several months agp

## 2023-03-05 NOTE — Discharge Instructions (Signed)
As we discussed, placed a referral to cardiology so they should call you to schedule an appointment in the clinic.  Sent prescriptions for cholesterol and blood pressure medicines.  As needed inhaler for your lungs.  If symptoms worsen despite these measures, chest pain at rest, please return to the ED

## 2023-03-05 NOTE — ED Triage Notes (Signed)
Patient reports chest tightness of shortness of breath with ambulation for months. Patient has not been seen by a PCP in years due to not having insurance. Years ago patient was supposed to have a follow-up ultrasound on his Aorta, but he had not been able to go due to lack of insurance.

## 2023-03-05 NOTE — ED Provider Notes (Signed)
[facility] Provider Note    Event Date/Time   First MD Initiated Contact with Patient 03/05/23 1702     (approximate)   History   Chest Pain and Shortness of Breath   HPI  [name] is a 62 y.o. male who presents to the ED for evaluation of Chest Pain and Shortness of Breath   Review of a cardiology clinic visit from 2 years ago.  History of CABG x 2 in 2015, HTN, HLD and tobacco abuse.  Patient presents with his wife for evaluation of chronic exertional dyspnea.  They report that he has been without medical insurance for the past couple years and so has unfortunately been without any prescription medications and not seeing any physicians due to financial/insurance reasons.  He reports chronic, months, exertional dyspnea and exertional chest discomfort improved with rest.  No acute changes but they got insurance and so he wanted to get reconnected.  Asymptomatic at rest right now.   Physical Exam   Triage Vital Signs: ED Triage Vitals  Encounter Vitals Group     BP 03/05/23 1438 (!) 159/96     Systolic BP Percentile --      Diastolic BP Percentile --      Pulse Rate 03/05/23 1437 79     Resp 03/05/23 1437 17     Temp 03/05/23 1437 98.6 F (37 C)     Temp Source 03/05/23 1437 Oral     SpO2 03/05/23 1437 97 %     Weight 03/05/23 1438 165 lb (74.8 kg)     Height 03/05/23 1438 5\' 4"  (1.626 m)     Head Circumference --      Peak Flow --      Pain Score 03/05/23 1444 5     Pain Loc --      Pain Education --      Exclude from Growth Chart --     Most recent vital signs: Vitals:   03/05/23 1444 03/05/23 1800  BP:  (!) 150/88  Pulse:  80  Resp:  16  Temp:    SpO2: 97% 97%    General: Awake, no distress.  CV:  Good peripheral perfusion.  Resp:  Normal effort.  Abd:  No distention.  MSK:  No deformity noted.  Neuro:  No focal deficits appreciated. Other:     ED Results / Procedures / Treatments   Labs (all labs ordered are  listed, but only abnormal results are displayed) Labs Reviewed  BASIC METABOLIC PANEL - Abnormal; Notable for the following components:      Result Value   Glucose, Bld 111 (*)    All other components within normal limits  CBC  BRAIN NATRIURETIC PEPTIDE  TROPONIN I (HIGH SENSITIVITY)  TROPONIN I (HIGH SENSITIVITY)    EKG Sinus rhythm with a rate of 74 bpm.  Normal axis and intervals.  Biphasic T waves laterally and inferiorly.  No further signs of acute ischemia. Similar morphology as comparison from 2.5 years ago.  RADIOLOGY CXR interpreted by me without evidence of acute cardiopulmonary pathology.  Official radiology report(s): DG Chest 2 View Result Date: 03/05/2023 CLINICAL DATA:  Chest pain. EXAM: CHEST - 2 VIEW COMPARISON:  01/10/2014. FINDINGS: Bilateral lung fields are clear. Bilateral costophrenic angles are clear. Normal cardio-mediastinal silhouette. Sternotomy wires noted. No acute osseous abnormalities. The soft tissues are within normal limits. IMPRESSION: No active cardiopulmonary disease. Electronically Signed   By: Jules Schick M.D.   On: 03/05/2023  16:32    PROCEDURES and INTERVENTIONS:  .1-3 Lead EKG Interpretation  Performed by: Delton Prairie, MD Authorized by: Delton Prairie, MD     Interpretation: normal     ECG rate:  80   ECG rate assessment: normal     Rhythm: sinus rhythm     Ectopy: none     Conduction: normal     Medications - No data to display   IMPRESSION / MDM / ASSESSMENT AND PLAN / ED COURSE  I reviewed the triage vital signs and the nursing notes.  Differential diagnosis includes, but is not limited to, ACS, PTX, PNA, muscle strain/spasm, PE, dissection, anxiety, pleural effusion  {Patient presents with symptoms of an acute illness or injury that is potentially life-threatening.   Patient presents with signs of chronic stable angina suitable for outpatient management.  He is referred to cardiology for close follow-up.  They report he  already has a PCP visit scheduled for next month.  Workup today is benign with a clear CXR, nonischemic EKG, 2 negative troponins, normal BNP, normal exam, normal metabolic panel and CBC.  I prescribed his outpatient medications, referred to cardiology discussed ED return precautions.     FINAL CLINICAL IMPRESSION(S) / ED DIAGNOSES   Final diagnoses:  Stable angina (HCC)  Other chest pain  Dyspnea on exertion  Coronary artery disease involving native coronary artery of native heart with unstable angina pectoris (HCC)     Rx / DC Orders   ED Discharge Orders          Ordered    ezetimibe (ZETIA) 10 MG tablet  Daily        03/05/23 1753    metoprolol succinate (TOPROL XL) 25 MG 24 hr tablet  Daily        03/05/23 1753    albuterol (VENTOLIN HFA) 108 (90 Base) MCG/ACT inhaler  Every 6 hours PRN        03/05/23 1753    hydrochlorothiazide (HYDRODIURIL) 25 MG tablet  Daily        03/05/23 1753    Ambulatory referral to Cardiology       Comments: If you have not heard from the Cardiology office within the next 72 hours please call [phone].   03/05/23 1812             Note:  This document was prepared using Dragon voice recognition software and may include unintentional dictation errors.   Delton Prairie, MD 03/05/23 [phone]

## 2023-03-09 ENCOUNTER — Ambulatory Visit: Payer: No Typology Code available for payment source | Attending: Student | Admitting: Student

## 2023-03-09 ENCOUNTER — Encounter: Payer: Self-pay | Admitting: Student

## 2023-03-09 VITALS — BP 110/70 | HR 72 | Ht 64.0 in | Wt 167.1 lb

## 2023-03-09 DIAGNOSIS — Z720 Tobacco use: Secondary | ICD-10-CM

## 2023-03-09 DIAGNOSIS — I5022 Chronic systolic (congestive) heart failure: Secondary | ICD-10-CM

## 2023-03-09 DIAGNOSIS — I25110 Atherosclerotic heart disease of native coronary artery with unstable angina pectoris: Secondary | ICD-10-CM | POA: Diagnosis not present

## 2023-03-09 DIAGNOSIS — R079 Chest pain, unspecified: Secondary | ICD-10-CM | POA: Diagnosis not present

## 2023-03-09 DIAGNOSIS — R0609 Other forms of dyspnea: Secondary | ICD-10-CM

## 2023-03-09 DIAGNOSIS — I6529 Occlusion and stenosis of unspecified carotid artery: Secondary | ICD-10-CM

## 2023-03-09 DIAGNOSIS — I714 Abdominal aortic aneurysm, without rupture, unspecified: Secondary | ICD-10-CM

## 2023-03-09 MED ORDER — ATORVASTATIN CALCIUM 40 MG PO TABS: 40.0000 mg | ORAL_TABLET | Freq: Every day | ORAL | 3 refills | Status: DC

## 2023-03-09 MED ORDER — NITROGLYCERIN 0.4 MG SL SUBL: 0.4000 mg | SUBLINGUAL_TABLET | SUBLINGUAL | 3 refills | Status: AC | PRN

## 2023-03-09 MED ORDER — ASPIRIN 81 MG PO TBEC
81.0000 mg | DELAYED_RELEASE_TABLET | Freq: Every day | ORAL | Status: AC
Start: 2023-03-09 — End: ?

## 2023-03-09 NOTE — Progress Notes (Signed)
 Cardiology Clinic Note   Date: 03/09/2023 ID: GURJOT BRISCO, DOB [birth year], MRN [id number]  Primary Cardiologist:  None  Chief Complaint   [name] is a 63 y.o. male who presents to the clinic today for ED visit follow up.   Patient Profile   [name] is followed by cardiology for the history outlined below.      Past medical history significant for: CAD. LHC 01/02/2014 (abnormal stress test): Ostial LAD 100% with collaterals.  Proximal RCA 60%.  Recommend CT surgery consult. CABG x 2 01/07/2014: LIMA to LAD, SVG to RCA. Nuclear stress test 10/11/2020: Small sized mild intensity fixed perfusion defect in apical inferior myocardium with associated mild hypokinesis.  Stress LVEF 41%. Chronic systolic heart failure/ischemic cardiomyopathy.  Echo 10/08/2020: EF 45 to 50%, mild LVH, abnormal septal wall motion due to postoperative CABG.  Grade I DD.  Trace AI.  Mildly dilated aortic root 40 mm. AAA. Aortic duplex 10/22/2020: Moderate dilatation of abdominal aorta at the mid aorta.  Abdominal aortic aneurysm measuring 3.07 x 3.01 x 3.05 cm.  Moderate mixed plaque noted in the proximal, mid and distal aorta.  Normal common iliac artery velocity.  Recommend recheck in 3 years. Carotid artery stenosis. Carotid duplex 10/08/2020: Bilateral ICA 16-49%. Bilateral CCA <50%.  Hypertension.  Hyperlipidemia.  Lipid panel 02/14/2021: LDL 82, HDL 48, TG 38, total 139. Tobacco abuse.  Quit smoking October 2024.  In summary, patient with history of CAD s/p CABG x 2 in December 2015.  Patient complained of a 2-year history of exertional shortness of breath and chest discomfort radiating to the neck.  Echo showed moderately depressed LV function. Coronary CTA showed moderate disease in the RCA and severe disease in the LAD. Repeat echo January 2016 showed normal LV function with mild septal and lateral hypokinesis, Grade I DD, mild MR. Patient was lost to follow-up until August 2022.  He reported  worsening dyspnea with intermittent episodes of sharp exertional chest pain.  Patient had not been taking cardiac medications.  Echo September 2022 showed EF 45 to 50% as detailed above.  Nuclear stress test showed fixed perfusion defect as detailed above.  Patient was last seen at [facility] by [name], PA-C on 02/04/2021 for routine follow up. He was doing well at that time and no changes were made.   Patient presented to the ED on 03/05/2023 for chest tightness and exertional shortness of breath for several months. He has not followed up with PCP or cardiology secondary to lack of insurance. He also reported not taking any medications for a couple of years. Initial labs: WBC 6.6, HGB 15.8, sodium 138, potassium 4.1, creatinine 1.16, BUN 15, BNP 58.2. Troponin was negative. EKG without acute changes. Patient was discharged on Zetia , Toprol , albuterol , and hydrochlorothiazide . He was referred to cardiology.      History of Present Illness    Today, patient is accompanied by his wife. He reports continued exertional chest tightness/pressure that has been progressively worsening over the last year. He also has dyspnea with minimal exertion. He has not been on cardiac medications since August 2023 when he lost his health insurance. He quit smoking 4 months ago. He denies lower extremity edema, orthopnea or PND. He has not been checking his BP at home but does have a cuff at home. He is now retired. He does a little bit of activity in his work room but because of his CP and dyspnea he is very limited.  ROS: All other systems reviewed and are otherwise negative except as noted in History of Present Illness.  EKGs/Labs Reviewed    EKG Interpretation Date/Time:  Friday March 09 2023 15:30:49 EST Ventricular Rate:  72 PR Interval:  150 QRS Duration:  84 QT Interval:  346 QTC Calculation: 378 R Axis:   -22  Text Interpretation: Normal sinus rhythm Nonspecific T wave  abnormality When compared with ECG of 05-Mar-2023 14:36, No significant change was found Confirmed by Loistine Sober [phone]) on 03/09/2023 3:32:59 PM   03/05/2023: BUN 15; Creatinine, Ser 1.16; Potassium 4.1; Sodium 138   03/05/2023: Hemoglobin 15.8; WBC 6.6   No results found for requested labs within last 365 days.   03/05/2023: B Natriuretic Peptide 58.2           Physical Exam    VS:  BP 110/70 (BP Location: Left Arm, Patient Position: Sitting, Cuff Size: Normal)   Pulse 72   Ht 5' 4 (1.626 m)   Wt 167 lb 2 oz (75.8 kg)   SpO2 97%   BMI 28.69 kg/m  , BMI Body mass index is 28.69 kg/m.  GEN: Well nourished, well developed, in no acute distress. Neck: No JVD or carotid bruits. Cardiac:  RRR. No murmurs. No rubs or gallops.   Respiratory:  Respirations regular and unlabored. Clear to auscultation without rales, wheezing or rhonchi. GI: Soft, nontender, nondistended. Extremities: Radials/DP/PT 2+ and equal bilaterally. No clubbing or cyanosis. No edema  Skin: Warm and dry, no rash. Neuro: Strength intact.  Assessment & Plan   CAD S/p CABG x 31 December 2013. Nuclear stress test September 2022 showed fixed perfusion defect. Patient recently evaluated in ED for chest pain and shortness of breath with unremarkable workup. He has not been on cardiac medications since August 2023. He was restarted on medications in the ED. He reports continued exertional chest tightness/pressure that has been progressively worsening over the last year with associated dyspnea. This pain is different than previous chest pain before CABG. EKG without ischemic changes.  -Schedule lexiscan .  -Continue Toprol , aspirin , Zetia . -Restart atorvastatin  40 mg nightly.   Chronic systolic heart failure/ischemic cardiomyopathy Echo September 2022 showed EF 45-50%, mild LVH, abnormal septal wall motion, grade I DD, trace AI, mildly dilated aortic root. Patient denies lower extremity edema. He has dyspnea with minimal  exertion that has been progressively worsening over the last year. Euvolemic and well compensated on exam.  -Continue Toprol . ARB was previously stopped secondary to hyperkalemia. Potassium was 4.1 on 2/3.   -Schedule echo -BP soft today. Will hold off on adding GDMT until follow up.   AAA Aortic duplex September 2022 showed moderate dilatation of abdominal aorta. AAA measuring 3.07 x 3.01 x 3.05 cm. Patient denies abdominal pain.   -Repeat AAA US .  Carotid artery stenosis Carotid duplex September 2022 showed bilateral ICA 16-49%. Patient denies lightheadedness, dizziness, presyncope or syncope.  -Repeat carotid duplex.  Hypertension BP today  110/70. Patient has not been checking BP at home but he does have a cuff at home. BP log provided and patient is instructed to check BP once a day.  -Continue hydrochlorothiazide , Toprol .   Hyperlipidemia  LDL January 2023 83, not at goal. Patient has not been on medication since August 2023. He was restarted on Zetia  in the ED on 2/3.  -Continue Zetia .  -Restart atorvastatin  40 mg nightly.  -Lipid panel at follow up. ]  Tobacco abuse Patient quit smoking 4 months ago. He is congratulated on his success.  -  Continued cessation encouraged.   Disposition: Lexiscan . Echo. AAA US . Carotid US . BP log. Atorvastatin  40 mg nightly. Return in 6-8 weeks after testing.      Informed Consent   Shared Decision Making/Informed Consent The risks [chest pain, shortness of breath, cardiac arrhythmias, dizziness, blood pressure fluctuations, myocardial infarction, stroke/transient ischemic attack, nausea, vomiting, allergic reaction, radiation exposure, metallic taste sensation and life-threatening complications (estimated to be 1 in 10,000)], benefits (risk stratification, diagnosing coronary artery disease, treatment guidance) and alternatives of a nuclear stress test were discussed in detail with Mr. Shuler and he agrees to proceed.      Signed, Barnie HERO.  Novak Stgermaine, DNP, NP-C

## 2023-03-09 NOTE — Patient Instructions (Signed)
 Your physician recommends the following medication changes.   START TAKING: Atorvastatin  40 mg nightly Aspirin  81 mg daily Nitroglycerin  0.4 mg (under the tongue) as needed for chest pain. Take 1  every 5 minutes while having chest pain - maximum dose of 3 tablets daily.   Lab Work: None ordered at this time  If you have labs (blood work) drawn today and your tests are completely normal, you will receive your results only by: MyChart Message (if you have MyChart) OR A paper copy in the mail If you have any lab test that is abnormal or we need to change your treatment, we will call you to review the results.   Testing/Procedures:  Your provider has ordered a Lexiscan  Myoview  Stress test. This will take place at [facility]. Please report to the [facility] medical mall entrance. The volunteers at the first desk will direct you where to go.  [facility] MYOVIEW   Your provider has ordered a Stress Test with nuclear imaging. The purpose of this test is to evaluate the blood supply to your heart muscle. This procedure is referred to as a Non-Invasive Stress Test. This is because other than having an IV started in your vein, nothing is inserted or invades your body. Cardiac stress tests are done to find areas of poor blood flow to the heart by determining the extent of coronary artery disease (CAD). Some patients exercise on a treadmill, which naturally increases the blood flow to your heart, while others who are unable to walk on a treadmill due to physical limitations will have a pharmacologic/chemical stress agent called Lexiscan  . This medicine will mimic walking on a treadmill by temporarily increasing your coronary blood flow.   Please note: these test may take anywhere between 2-4 hours to complete  How to prepare for your Myoview  test:  Nothing to eat for 6 hours prior to the test No caffeine for 24 hours prior to test No smoking 24 hours prior to test. Your medication may be taken with water .  If  your doctor stopped a medication because of this test, do not take that medication. No perfume, cologne or lotion. Wear comfortable walking shoes.   PLEASE NOTIFY THE OFFICE AT LEAST 24 HOURS IN ADVANCE IF YOU ARE UNABLE TO KEEP YOUR APPOINTMENT.  [phone] AND  PLEASE NOTIFY NUCLEAR MEDICINE AT [facility] AT LEAST 24 HOURS IN ADVANCE IF YOU ARE UNABLE TO KEEP YOUR APPOINTMENT. [phone]   Your physician has requested that you have an abdominal aorta duplex. During this test, an ultrasound is used to evaluate the aorta. Allow 30 minutes for this exam. Do not eat after midnight the day before and avoid carbonated beverages.  Please note: We ask at that you not bring children with you during ultrasound (echo/ vascular) testing. Due to room size and safety concerns, children are not allowed in the ultrasound rooms during exams. Our front office staff cannot provide observation of children in our lobby area while testing is being conducted. An adult accompanying a patient to their appointment will only be allowed in the ultrasound room at the discretion of the ultrasound technician under special circumstances. We apologize for any inconvenience.   Your physician has requested that you have a carotid duplex. This test is an ultrasound of the carotid arteries in your neck. It looks at blood flow through these arteries that supply the brain with blood.   Allow one hour for this exam.  There are no restrictions or special instructions.  This will take place  at [address], [place] [address]  Please note: We ask at that you not bring children with you during ultrasound (echo/ vascular) testing. Due to room size and safety concerns, children are not allowed in the ultrasound rooms during exams. Our front office staff cannot provide observation of children in our lobby area while testing is being conducted. An adult accompanying a patient to their appointment will only  be allowed in the ultrasound room at the discretion of the ultrasound technician under special circumstances. We apologize for any inconvenience.  Your physician has requested that you have an echocardiogram. Echocardiography is a painless test that uses sound waves to create images of your heart. It provides your doctor with information about the size and shape of your heart and how well your heart's chambers and valves are working.   You may receive an ultrasound enhancing agent through an IV if needed to better visualize your heart during the echo. This procedure takes approximately one hour.  There are no restrictions for this procedure.  This will take place at [address] [address], [place] [address]  Please note: We ask at that you not bring children with you during ultrasound (echo/ vascular) testing. Due to room size and safety concerns, children are not allowed in the ultrasound rooms during exams. Our front office staff cannot provide observation of children in our lobby area while testing is being conducted. An adult accompanying a patient to their appointment will only be allowed in the ultrasound room at the discretion of the ultrasound technician under special circumstances. We apologize for any inconvenience.    Follow-Up: At [facility], you and your health needs are our priority.  As part of our continuing mission to provide you with exceptional heart care, we have created designated Provider Care Teams.  These Care Teams include your primary Cardiologist (physician) and Advanced Practice Providers (APPs -  Physician Assistants and Nurse Practitioners) who all work together to provide you with the care you need, when you need it.   Your next appointment:   6-8 week(s)  Provider:   You may see Ladona Heinz, MD or one of the following Advanced Practice Providers on your designated Care Team:   Lonni Meager, NP Bernardino Bring, PA-C Cadence  [place], PA-C Tylene Lunch, NP Barnie Hila, NP

## 2023-03-15 ENCOUNTER — Ambulatory Visit: Payer: No Typology Code available for payment source

## 2023-03-29 ENCOUNTER — Ambulatory Visit: Payer: No Typology Code available for payment source | Admitting: Nurse Practitioner

## 2023-03-29 ENCOUNTER — Ambulatory Visit: Payer: 59 | Admitting: General Practice

## 2023-03-29 ENCOUNTER — Encounter: Payer: Self-pay | Admitting: Nurse Practitioner

## 2023-03-29 VITALS — BP 122/74 | HR 67 | Temp 97.5°F | Ht 64.25 in | Wt 169.9 lb

## 2023-03-29 DIAGNOSIS — Z23 Encounter for immunization: Secondary | ICD-10-CM

## 2023-03-29 DIAGNOSIS — H9313 Tinnitus, bilateral: Secondary | ICD-10-CM | POA: Insufficient documentation

## 2023-03-29 DIAGNOSIS — Z114 Encounter for screening for human immunodeficiency virus [HIV]: Secondary | ICD-10-CM

## 2023-03-29 DIAGNOSIS — R202 Paresthesia of skin: Secondary | ICD-10-CM

## 2023-03-29 DIAGNOSIS — Z0000 Encounter for general adult medical examination without abnormal findings: Secondary | ICD-10-CM

## 2023-03-29 DIAGNOSIS — E7800 Pure hypercholesterolemia, unspecified: Secondary | ICD-10-CM | POA: Diagnosis not present

## 2023-03-29 DIAGNOSIS — G479 Sleep disorder, unspecified: Secondary | ICD-10-CM

## 2023-03-29 DIAGNOSIS — F419 Anxiety disorder, unspecified: Secondary | ICD-10-CM

## 2023-03-29 DIAGNOSIS — Z131 Encounter for screening for diabetes mellitus: Secondary | ICD-10-CM

## 2023-03-29 DIAGNOSIS — I10 Essential (primary) hypertension: Secondary | ICD-10-CM | POA: Diagnosis not present

## 2023-03-29 DIAGNOSIS — I2510 Atherosclerotic heart disease of native coronary artery without angina pectoris: Secondary | ICD-10-CM

## 2023-03-29 DIAGNOSIS — Z87891 Personal history of nicotine dependence: Secondary | ICD-10-CM | POA: Diagnosis not present

## 2023-03-29 DIAGNOSIS — Z125 Encounter for screening for malignant neoplasm of prostate: Secondary | ICD-10-CM

## 2023-03-29 DIAGNOSIS — E663 Overweight: Secondary | ICD-10-CM | POA: Diagnosis not present

## 2023-03-29 DIAGNOSIS — Z1211 Encounter for screening for malignant neoplasm of colon: Secondary | ICD-10-CM

## 2023-03-29 DIAGNOSIS — Z122 Encounter for screening for malignant neoplasm of respiratory organs: Secondary | ICD-10-CM

## 2023-03-29 DIAGNOSIS — Z1159 Encounter for screening for other viral diseases: Secondary | ICD-10-CM

## 2023-03-29 MED ORDER — TRAZODONE HCL 50 MG PO TABS
25.0000 mg | ORAL_TABLET | Freq: Every evening | ORAL | 0 refills | Status: DC | PRN
Start: 2023-03-29 — End: 2023-04-27

## 2023-03-29 NOTE — Assessment & Plan Note (Signed)
 Patient currently followed by cardiology.  Pending nuclear med stress test along with ultrasound of for AAA, ultrasound carotid, echocardiogram.  Patient is currently maintained on atorvastatin 40 mg and ezetimibe 10 mg daily.  Continue taking medication as prescribed pending lipid panel today

## 2023-03-29 NOTE — Patient Instructions (Signed)
 Nice to see you today I will be in touch with the labs once I have them Follow up with me in 4 weeks, sooner If  you need me

## 2023-03-29 NOTE — Assessment & Plan Note (Signed)
 Patient currently maintained on atorvastatin 40 mg and ezetimibe 10 mg daily.  Pending lipid panel today.  Continue medication as prescribed

## 2023-03-29 NOTE — Assessment & Plan Note (Signed)
 Patient currently maintained on hydrochlorothiazide 25 mg and metoprolol 25 mg daily.  Blood pressure well-controlled.  Continue taking medication as prescribed

## 2023-03-29 NOTE — Assessment & Plan Note (Signed)
 History of the same patient termed as neuropathy and was on gabapentin per their report in the past.  Pending B12 level today along with TSH and basic electrolytes

## 2023-03-29 NOTE — Assessment & Plan Note (Signed)
 Pending TSH, A1c, lipid panel.

## 2023-03-29 NOTE — Assessment & Plan Note (Signed)
 Trazodone 25 to 50 mg nightly as needed.  Has tried and failed hydroxyzine

## 2023-03-29 NOTE — Assessment & Plan Note (Signed)
 Pending urine microscopy to rule out microscopic hematuria

## 2023-03-29 NOTE — Progress Notes (Signed)
 New Patient Office Visit  Subjective    [name], male    DOB: 02/10/1960  Age: 63 y.o. MRN: [id number]  CC:  Chief Complaint  Patient presents with   Establish Care    General check up. Pt would like to discuss incsrease in anxiety medicaiton.    Medication Refill    Vistaril     HPI Kaedan A Pasch presents to establish care   CAD with stenting: Patient is followed by cardioloyg and has a histroy of a stent. He is currenlty on ASA, atrovastatin40, and exetimibe  HTN: currenlty on hydrochlorothiazide 25 and metoprolol 25 daily. Checks blood pressure daily at home and with in the normal limits majority of the time  HLD: on atorvastatin 40 and exetimibe 10  Anxiety: currently maintained on hydroxyzine 25mg  at night for anxiety and sleep. States that he will talk in his sleep with that medication.  States when he goes to lay down he may not be tired when his brain is like an "ping-pong ball"  for complete physical and follow up of chronic conditions.  Immunizations: -Tetanus: Update today -Influenza: refused  -Shingles: refused  -Pneumonia: Completed 2015  Diet: Fair diet. He will eat 2-3 meals a day. Does have some snacks. He will drink little fluids. He will drink tea and some water  Exercise: No regular exercise.  Eye exam: lasix Dental exam: needs updating  Colonoscopy: Ambulatory referral to [facility]: Ambulatory referral placed today  PSA: Due  Sleep: goes to bed around 2-3 in the am but use to go to bed around 9 and get up 445am. States that when he lays down his brain wont close off        03/29/2023    2:12 PM  PHQ9 SCORE ONLY  PHQ-9 Total Score 9       03/29/2023    2:10 PM  GAD 7 : Generalized Anxiety Score  Nervous, Anxious, on Edge 3  Control/stop worrying 3  Worry too much - different things 3  Trouble relaxing 3  Restless 0  Easily annoyed or irritable 1  Afraid - awful might happen 1  Total GAD 7  Score 14  Anxiety Difficulty Not difficult at all       Outpatient Encounter Medications as of 03/29/2023  Medication Sig   acetaminophen (TYLENOL) 500 MG tablet Take 1,000 mg by mouth every 6 (six) hours as needed.   albuterol (PROAIR HFA) 108 (90 Base) MCG/ACT inhaler 2 puff(s) inhaled every 6 hours for 30 day(s)   albuterol (VENTOLIN HFA) 108 (90 Base) MCG/ACT inhaler Inhale 2 puffs into the lungs every 6 (six) hours as needed for wheezing or shortness of breath.   aspirin EC 81 MG tablet Take 1 tablet (81 mg total) by mouth daily. Swallow whole.   atorvastatin (LIPITOR) 40 MG tablet Take 1 tablet (40 mg total) by mouth daily.   ezetimibe (ZETIA) 10 MG tablet TAKE 1 TABLET (10 MG TOTAL) BY MOUTH DAILY.   hydrochlorothiazide (HYDRODIURIL) 25 MG tablet Take 1 tablet (25 mg total) by mouth daily.   hydrOXYzine (VISTARIL) 25 MG capsule Take 25 mg by mouth at bedtime.   metoprolol succinate (TOPROL XL) 25 MG 24 hr tablet Take 1 tablet (25 mg total) by mouth daily.   naproxen sodium (ALEVE) 220 MG tablet Take 220 mg by mouth 2 (two) times daily as needed.   nitroGLYCERIN (NITROSTAT) 0.4 MG SL tablet Place 1 tablet (0.4 mg total) under  the tongue every 5 (five) minutes as needed for chest pain.   traZODone (DESYREL) 50 MG tablet Take 0.5-1 tablets (25-50 mg total) by mouth at bedtime as needed.   [DISCONTINUED] aspirin EC (ASPIRIN 81) 81 MG tablet 1 tab(s) orally once a day for 90 days   No facility-administered encounter medications on file as of 03/29/2023.    Past Medical History:  Diagnosis Date   Anginal pain (HCC)    Anxiety    Hyperlipidemia    Hypertension    Myocardial infarction [facility])    Neuropathy     Past Surgical History:  Procedure Laterality Date   COLONOSCOPY     CORONARY ARTERY BYPASS GRAFT N/A 01/07/2014   Procedure: CORONARY ARTERY BYPASS GRAFTING (CABG);  Surgeon: Delight Ovens, MD;  Location: [facility] OR;  Service: Open Heart Surgery;  Laterality: N/A;  Coronary  Artery Bypass Graft times two with internal mammary artery to Left Anterior Descending Coronary Artery and SVG to Distal Right Coronary Artery   FOOT SURGERY     right   REFRACTIVE SURGERY Bilateral 2021   TEE WITHOUT CARDIOVERSION N/A 01/07/2014   Procedure: TRANSESOPHAGEAL ECHOCARDIOGRAM (TEE);  Surgeon: Delight Ovens, MD;  Location: [facility] OR;  Service: Open Heart Surgery;  Laterality: N/A;    Family History  Problem Relation Age of Onset   Diabetes Father    Diabetes Sister    Mental illness Sister     Social History   Socioeconomic History   Marital status: Married    Spouse name: Not on file   Number of children: 2   Years of education: Not on file   Highest education level: 9th grade  Occupational History   Not on file  Tobacco Use   Smoking status: Former    Current packs/day: 0.00    Average packs/day: 0.3 packs/day for 50.0 years (12.5 ttl pk-yrs)    Types: Cigarettes    Quit date: 11/19/2022    Years since quitting: 0.3   Smokeless tobacco: Never  Vaping Use   Vaping status: Never Used  Substance and Sexual Activity   Alcohol use: No   Drug use: Yes    Types: Marijuana   Sexual activity: Not on file  Other Topics Concern   Not on file  Social History Narrative   Not on file   Social Drivers of Health   Financial Resource Strain: Low Risk  (03/24/2023)   Overall Financial Resource Strain (CARDIA)    Difficulty of Paying Living Expenses: Not very hard  Food Insecurity: No Food Insecurity (03/24/2023)   Hunger Vital Sign    Worried About Running Out of Food in the Last Year: Never true    Ran Out of Food in the Last Year: Never true  Transportation Needs: No Transportation Needs (03/24/2023)   PRAPARE - Administrator, Civil Service (Medical): No    Lack of Transportation (Non-Medical): No  Physical Activity: Unknown (03/24/2023)   Exercise Vital Sign    Days of Exercise per Week: 0 days    Minutes of Exercise per Session: Not on file   Stress: Stress Concern Present (03/24/2023)   [employer] of Occupational Health - Occupational Stress Questionnaire    Feeling of Stress : Very much  Social Connections: Moderately Isolated (03/24/2023)   Social Connection and Isolation Panel [NHANES]    Frequency of Communication with Friends and Family: Three times a week    Frequency of Social Gatherings with Friends and Family: Three times a  week    Attends Religious Services: Never    Active Member of Clubs or Organizations: No    Attends Engineer, structural: Not on file    Marital Status: Married  Catering manager Violence: Not on file    Review of Systems  Constitutional:  Negative for chills and fever.  Respiratory:  Positive for shortness of breath.   Cardiovascular:  Negative for chest pain (with exertion) and leg swelling.  Gastrointestinal:  Negative for abdominal pain, blood in stool, constipation, diarrhea, nausea and vomiting.       BM daily  Genitourinary:  Negative for dysuria and hematuria.  Neurological:  Negative for tingling and headaches.  Psychiatric/Behavioral:  Negative for hallucinations and suicidal ideas.         Objective    BP 122/74   Pulse 67   Temp (!) 97.5 F (36.4 C) (Oral)   Ht 5' 4.25" (1.632 m)   Wt 169 lb 14.4 oz (77.1 kg)   SpO2 95%   BMI 28.94 kg/m   Physical Exam Vitals and nursing note reviewed.  Constitutional:      Appearance: Normal appearance.  HENT:     Right Ear: Tympanic membrane, ear canal and external ear normal.     Left Ear: Tympanic membrane, ear canal and external ear normal.     Mouth/Throat:     Mouth: Mucous membranes are moist.     Pharynx: Oropharynx is clear.  Eyes:     Pupils: Pupils are unequal.     Left eye: Pupil is not reactive.     Comments: Left pupil 5 mm Right pupil 2 mm  Cardiovascular:     Rate and Rhythm: Normal rate and regular rhythm.     Pulses: Normal pulses.     Heart sounds: Normal heart sounds.  Pulmonary:      Effort: Pulmonary effort is normal.     Breath sounds: Normal breath sounds.  Abdominal:     General: Bowel sounds are normal. There is no distension.     Palpations: There is no mass.     Tenderness: There is no abdominal tenderness.     Hernia: No hernia is present.  Musculoskeletal:     Right lower leg: No edema.     Left lower leg: No edema.  Lymphadenopathy:     Cervical: No cervical adenopathy.  Skin:    General: Skin is warm.  Neurological:     General: No focal deficit present.     Mental Status: He is alert.     Deep Tendon Reflexes:     Reflex Scores:      Bicep reflexes are 2+ on the right side and 2+ on the left side.      Patellar reflexes are 2+ on the right side and 2+ on the left side.    Comments: Bilateral upper and lower extremity strength 5/5  Psychiatric:        Mood and Affect: Mood normal.        Behavior: Behavior normal.        Thought Content: Thought content normal.        Judgment: Judgment normal.         Assessment & Plan:   Problem List Items Addressed This Visit       Cardiovascular and Mediastinum   CAD (coronary artery disease)   Patient currently followed by cardiology.  Pending nuclear med stress test along with ultrasound of for AAA, ultrasound carotid, echocardiogram.  Patient  is currently maintained on atorvastatin 40 mg and ezetimibe 10 mg daily.  Continue taking medication as prescribed pending lipid panel today      Primary hypertension   Patient currently maintained on hydrochlorothiazide 25 mg and metoprolol 25 mg daily.  Blood pressure well-controlled.  Continue taking medication as prescribed      Relevant Orders   CBC   Comprehensive metabolic panel     Other   Hypercholesteremia   Patient currently maintained on atorvastatin 40 mg and ezetimibe 10 mg daily.  Pending lipid panel today.  Continue medication as prescribed      Relevant Orders   Lipid panel   History of tobacco abuse   Pending urine microscopy to  rule out microscopic hematuria.      Relevant Orders   Urine Microscopic   Anxiety   Hydroxyzine somewhat effective.  Will try trazodone for sleep if this is ineffective consider SSRI along with trazodone.      Relevant Medications   traZODone (DESYREL) 50 MG tablet   Other Relevant Orders   TSH   Preventative health care - Primary   Discussed age-appropriate immunizations and screening exams.  Did review patient's personal, surgical, social, family histories.  Patient is up-to-date on all age-appropriate vaccinations he would like.  Patient received Tdap in office today.  Patient refused flu, pneumonia, shingles vaccine today.  Ambulatory furl to GI for CRC screening today.  PSA placed for prostate cancer screening.  Ambulatory referral to lung cancer screening today.  Patient was given information at discharge about preventative healthcare maintenance with anticipatory guidance      Relevant Orders   CBC   Comprehensive metabolic panel   Sleep disturbance   Trazodone 25 to 50 mg nightly as needed.  Has tried and failed hydroxyzine      Relevant Medications   traZODone (DESYREL) 50 MG tablet   Other Relevant Orders   TSH   Overweight   Pending TSH, A1c, lipid panel.      Relevant Orders   Hemoglobin A1c   Paresthesia   History of the same patient termed as neuropathy and was on gabapentin per their report in the past.  Pending B12 level today along with TSH and basic electrolytes      Relevant Orders   Vitamin B12   Other Visit Diagnoses       Encounter for hepatitis C screening test for low risk patient       Relevant Orders   Hepatitis C Antibody     Encounter for screening for HIV       Relevant Orders   HIV antibody (with reflex)     Screening for diabetes mellitus       Relevant Orders   Hemoglobin A1c     Screening for prostate cancer       Relevant Orders   PSA     Screening for colon cancer       Relevant Orders   Ambulatory referral to  Gastroenterology     Screening for lung cancer       Relevant Orders   Ambulatory Referral Lung Cancer Screening [facility]     Need for Tdap vaccination       Relevant Orders   Tdap vaccine greater than or equal to 7yo IM (Completed)       Return in about 4 weeks (around 04/26/2023) for GAD/Sleep.   Audria Nine, NP

## 2023-03-29 NOTE — Assessment & Plan Note (Signed)
 Discussed age-appropriate immunizations and screening exams.  Did review patient's personal, surgical, social, family histories.  Patient is up-to-date on all age-appropriate vaccinations he would like.  Patient received Tdap in office today.  Patient refused flu, pneumonia, shingles vaccine today.  Ambulatory furl to GI for CRC screening today.  PSA placed for prostate cancer screening.  Ambulatory referral to lung cancer screening today.  Patient was given information at discharge about preventative healthcare maintenance with anticipatory guidance

## 2023-03-29 NOTE — Assessment & Plan Note (Signed)
 Hydroxyzine somewhat effective.  Will try trazodone for sleep if this is ineffective consider SSRI along with trazodone.

## 2023-03-30 ENCOUNTER — Other Ambulatory Visit: Payer: Self-pay | Admitting: Emergency Medicine

## 2023-03-30 LAB — LIPID PANEL
Cholesterol: 151 mg/dL (ref 0–200)
HDL: 48.6 mg/dL (ref >39.00)
LDL Cholesterol: 91 mg/dL (ref 0–99)
NonHDL: 102.2
Total CHOL/HDL Ratio: 3
Triglycerides: 57 mg/dL (ref 0.0–149.0)
VLDL: 11.4 mg/dL (ref 0.0–40.0)

## 2023-03-30 LAB — CBC
HCT: 45.6 % (ref 39.0–52.0)
Hemoglobin: 15.3 g/dL (ref 13.0–17.0)
MCHC: 33.6 g/dL (ref 30.0–36.0)
MCV: 87 fl (ref 78.0–100.0)
Platelets: 200 10*3/uL (ref 150.0–400.0)
RBC: 5.24 Mil/uL (ref 4.22–5.81)
RDW: 14.3 % (ref 11.5–15.5)
WBC: 7.5 10*3/uL (ref 4.0–10.5)

## 2023-03-30 LAB — COMPREHENSIVE METABOLIC PANEL
ALT: 30 U/L (ref 0–53)
AST: 17 U/L (ref 0–37)
Albumin: 4.2 g/dL (ref 3.5–5.2)
Alkaline Phosphatase: 86 U/L (ref 39–117)
BUN: 17 mg/dL (ref 6–23)
CO2: 29 meq/L (ref 19–32)
Calcium: 9.2 mg/dL (ref 8.4–10.5)
Chloride: 101 meq/L (ref 96–112)
Creatinine, Ser: 1.08 mg/dL (ref 0.40–1.50)
GFR: 72.66 mL/min (ref >60.00)
Glucose, Bld: 99 mg/dL (ref 70–99)
Potassium: 4.4 meq/L (ref 3.5–5.1)
Sodium: 138 meq/L (ref 135–145)
Total Bilirubin: 0.6 mg/dL (ref 0.2–1.2)
Total Protein: 6.5 g/dL (ref 6.0–8.3)

## 2023-03-30 LAB — URINALYSIS, MICROSCOPIC ONLY

## 2023-03-30 LAB — HIV ANTIBODY (ROUTINE TESTING W REFLEX): HIV 1&2 Ab, 4th Generation: NONREACTIVE

## 2023-03-30 LAB — TSH: TSH: 1.79 u[IU]/mL (ref 0.35–5.50)

## 2023-03-30 LAB — PSA: PSA: 1.01 ng/mL (ref 0.10–4.00)

## 2023-03-30 LAB — VITAMIN B12: Vitamin B-12: 300 pg/mL (ref 211–911)

## 2023-03-30 LAB — HEPATITIS C ANTIBODY: Hepatitis C Ab: NONREACTIVE

## 2023-03-30 LAB — HEMOGLOBIN A1C: Hgb A1c MFr Bld: 6.5 % (ref 4.6–6.5)

## 2023-04-02 ENCOUNTER — Encounter
Admission: RE | Admit: 2023-04-02 | Discharge: 2023-04-02 | Disposition: A | Discharge status: Home / Self Care | Payer: No Typology Code available for payment source | Source: Ambulatory Visit | Attending: Student | Admitting: Student

## 2023-04-02 ENCOUNTER — Telehealth: Payer: Self-pay | Admitting: Cardiology

## 2023-04-02 ENCOUNTER — Encounter: Payer: Self-pay | Admitting: Nurse Practitioner

## 2023-04-02 ENCOUNTER — Other Ambulatory Visit: Payer: Self-pay | Admitting: Nurse Practitioner

## 2023-04-02 DIAGNOSIS — E118 Type 2 diabetes mellitus with unspecified complications: Secondary | ICD-10-CM

## 2023-04-02 DIAGNOSIS — R079 Chest pain, unspecified: Secondary | ICD-10-CM | POA: Diagnosis present

## 2023-04-02 LAB — NM MYOCAR MULTI W/SPECT W/WALL MOTION / EF
Estimated workload: 1
Exercise duration (min): 0 min
Exercise duration (sec): 0 s
LV dias vol: 53 mL (ref 62–150)
LV sys vol: 14 mL
MPHR: 156 {beats}/min
Peak HR: 82 {beats}/min
Percent HR: 52 %
Rest HR: 60 {beats}/min
Rest Nuclear Isotope Dose: 10.4 mCi
SDS: 2
SRS: 9
SSS: 9
ST Depression (mm): 0 mm
Stress Nuclear Isotope Dose: 32.8 mCi
TID: 0.9

## 2023-04-02 MED ORDER — TECHNETIUM TC 99M TETROFOSMIN IV KIT
32.7600 | PACK | Freq: Once | INTRAVENOUS | Status: AC | PRN
  Administered 2023-04-02: 32.76 via INTRAVENOUS

## 2023-04-02 MED ORDER — TECHNETIUM TC 99M TETROFOSMIN IV KIT
10.3500 | PACK | Freq: Once | INTRAVENOUS | Status: AC | PRN
  Administered 2023-04-02: 10.35 via INTRAVENOUS

## 2023-04-02 MED ORDER — REGADENOSON 0.4 MG/5ML IV SOLN
0.4000 mg | Freq: Once | INTRAVENOUS | Status: AC
  Administered 2023-04-02: 0.4 mg via INTRAVENOUS

## 2023-04-02 NOTE — Telephone Encounter (Signed)
 Wife Marily Memos) called to follow-up on getting prior authorization to get Echocardiogram test.

## 2023-04-04 ENCOUNTER — Ambulatory Visit: Payer: No Typology Code available for payment source

## 2023-04-04 ENCOUNTER — Ambulatory Visit: Payer: No Typology Code available for payment source | Attending: Student

## 2023-04-04 ENCOUNTER — Ambulatory Visit (INDEPENDENT_AMBULATORY_CARE_PROVIDER_SITE_OTHER): Payer: No Typology Code available for payment source

## 2023-04-04 DIAGNOSIS — I6523 Occlusion and stenosis of bilateral carotid arteries: Secondary | ICD-10-CM | POA: Diagnosis not present

## 2023-04-04 DIAGNOSIS — I7143 Infrarenal abdominal aortic aneurysm, without rupture: Secondary | ICD-10-CM

## 2023-04-04 DIAGNOSIS — I714 Abdominal aortic aneurysm, without rupture, unspecified: Secondary | ICD-10-CM

## 2023-04-04 DIAGNOSIS — I6529 Occlusion and stenosis of unspecified carotid artery: Secondary | ICD-10-CM

## 2023-04-19 ENCOUNTER — Ambulatory Visit: Payer: 59 | Admitting: Nurse Practitioner

## 2023-04-25 ENCOUNTER — Ambulatory Visit: Attending: Student

## 2023-04-25 DIAGNOSIS — R0609 Other forms of dyspnea: Secondary | ICD-10-CM | POA: Diagnosis not present

## 2023-04-25 LAB — ECHOCARDIOGRAM COMPLETE
AR max vel: 3.28 cm2
AV Area VTI: 3.18 cm2
AV Area mean vel: 3.13 cm2
AV Mean grad: 2 mmHg
AV Peak grad: 3.9 mmHg
Ao pk vel: 0.99 m/s
Area-P 1/2: 3.42 cm2
Calc EF: 59 %
S' Lateral: 3 cm
Single Plane A2C EF: 54.5 %
Single Plane A4C EF: 60.3 %

## 2023-04-27 ENCOUNTER — Ambulatory Visit: Payer: No Typology Code available for payment source | Admitting: Nurse Practitioner

## 2023-04-27 ENCOUNTER — Other Ambulatory Visit: Payer: Self-pay | Admitting: Nurse Practitioner

## 2023-04-27 VITALS — BP 136/72 | HR 70 | Temp 97.7°F | Ht 64.25 in | Wt 173.0 lb

## 2023-04-27 DIAGNOSIS — F419 Anxiety disorder, unspecified: Secondary | ICD-10-CM

## 2023-04-27 DIAGNOSIS — F411 Generalized anxiety disorder: Secondary | ICD-10-CM | POA: Diagnosis not present

## 2023-04-27 DIAGNOSIS — E119 Type 2 diabetes mellitus without complications: Secondary | ICD-10-CM

## 2023-04-27 DIAGNOSIS — G479 Sleep disorder, unspecified: Secondary | ICD-10-CM

## 2023-04-27 MED ORDER — SERTRALINE HCL 50 MG PO TABS: ORAL_TABLET | ORAL | 0 refills | Status: DC

## 2023-04-27 NOTE — Patient Instructions (Signed)
Nice to see you today I want to see you in 2 months, sooner if you need me

## 2023-04-27 NOTE — Progress Notes (Signed)
   Established Patient Office Visit  Subjective   [name], male    DOB: 06-27-1960  Age: 63 y.o. MRN: [id number]  Chief Complaint  Patient presents with   Anxiety    Pt complains of sleep getting better but anxiety remains the same. Pt has been worried about testing for cardiology.       GAD: patient was seen by me on 03/29/2023 for his CPE and was having trouble with anxiety and sleep. He had tried and failed hydroxyzine. He was given trazodone to help with sleep and is here for a follow up. States that he is doing the whole tablet and is sleeping better. States that he is having anxiety every day and is worrying about stuff every day He goes to bed around 10ish and will get up around 8am   DM2: states that they were contacted by the diabetic educator but they declined at the current time while they are getting the heart tested. He has quit smoking but in doing so he has gained weight.     Review of Systems  Constitutional:  Negative for chills and fever.  Respiratory:  Negative for shortness of breath (nothing new).   Cardiovascular:  Negative for chest pain.  Psychiatric/Behavioral:  Negative for hallucinations and suicidal ideas. The patient does not have insomnia.       Objective:     BP 136/72   Pulse 70   Temp 97.7 F (36.5 C) (Oral)   Ht 5' 4.25" (1.632 m)   Wt 173 lb (78.5 kg)   SpO2 95%   BMI 29.46 kg/m  BP Readings from Last 3 Encounters:  04/27/23 136/72  03/29/23 122/74  03/09/23 110/70   Wt Readings from Last 3 Encounters:  04/27/23 173 lb (78.5 kg)  03/29/23 169 lb 14.4 oz (77.1 kg)  03/09/23 167 lb 2 oz (75.8 kg)   SpO2 Readings from Last 3 Encounters:  04/27/23 95%  03/29/23 95%  03/09/23 97%      Physical Exam Vitals and nursing note reviewed.  Constitutional:      Appearance: Normal appearance.  Cardiovascular:     Rate and Rhythm: Normal rate and regular rhythm.     Heart sounds: Normal heart sounds.  Pulmonary:      Effort: Pulmonary effort is normal.     Breath sounds: Normal breath sounds.  Neurological:     Mental Status: He is alert.      No results found for any visits on 04/27/23.    The 10-year ASCVD risk score (Arnett DK, et al., 2019) is: 22.9%    Assessment & Plan:   Problem List Items Addressed This Visit       Endocrine   Diet-controlled diabetes mellitus (HCC)   A1C 6.5% at last office visit. Referred to DM educator. Printed off basic nutrition education for patient at discharge. Discussed limiting the carbs in her diet. Also discussed about cutting out plain and processed sugars.         Other   GAD (generalized anxiety disorder) - Primary   Has tried trazodone. Helped with sleep only. Will start patient on zoloft 25mg  daily for 2 weeks then titrate up to 50mg  daily until office follow up. He denies HI/SI/AVH      Relevant Medications   sertraline (ZOLOFT) 50 MG tablet    Return in about 2 months (around 06/27/2023) for DM recheck.    Audria Nine, NP

## 2023-04-27 NOTE — Assessment & Plan Note (Signed)
 Has tried trazodone. Helped with sleep only. Will start patient on zoloft 25mg  daily for 2 weeks then titrate up to 50mg  daily until office follow up. He denies HI/SI/AVH

## 2023-04-27 NOTE — Assessment & Plan Note (Signed)
 A1C 6.5% at last office visit. Referred to DM educator. Printed off basic nutrition education for patient at discharge. Discussed limiting the carbs in her diet. Also discussed about cutting out plain and processed sugars.

## 2023-05-04 NOTE — Progress Notes (Signed)
 Cardiology Clinic Note   Date: 05/10/2023 ID: [name], DOB [birth year], MRN [id number]  Primary Cardiologist:  None  Chief Complaint   [name] is a 63 y.o. male who presents to the clinic today for follow up after testing.   Patient Profile   [name] is followed by cardiology for the history outlined below.       Past medical history significant for: CAD. LHC 01/02/2014 (abnormal stress test): Ostial LAD 100% with collaterals.  Proximal RCA 60%.  Recommend CT surgery consult. CABG x 2 01/07/2014: LIMA to LAD, SVG to RCA. Nuclear stress test 04/02/2023: Intermediate risk study. No evidence of ischemia. There is a medium defect with severe reduction in uptake present in the apical inferior, inferolateral and apex locations that is fixed. There is abnormal wall motion in the defect area consistent with infarction.  Chronic systolic heart failure/ischemic cardiomyopathy.  Echo 04/25/2023: EF 55 to 60%. No RWMA. Severe asymmetric LVH basal-septal segment. Grade I DD. Normal RV size/function. Normal PA pressure, RVSP 29.4. Aortic valve sclerosis without stenosis. Borderline dilatation of aortic root 38 mm.  AAA. AAA duplex 04/04/2023: Dilated infrarenal fusiform dilatation, 2.7 cm (decreased from previous 3.30 September 2020). Right common iliac >50%, left external iliac >50%.  Carotid artery stenosis. Carotid duplex 04/04/2023: Right ICA 40-59%, ECA <50%. Left ICA 1-39%, ECA >50%. Hypertension.  Hyperlipidemia.  Lipid panel 03/29/2023: LDL 91, HDL 49, TG 57, total 151. T2DM. Former tobacco abuse.  Quit smoking October 2024.  In summary, patient with history of CAD s/p CABG x 2 in December 2015.  Patient complained of a 2-year history of exertional shortness of breath and chest discomfort radiating to the neck.  Echo showed moderately depressed LV function. Coronary CTA showed moderate disease in the RCA and severe disease in the LAD. Repeat echo January 2016 showed normal LV  function with mild septal and lateral hypokinesis, Grade I DD, mild MR. Patient was lost to follow-up until August 2022.  He reported worsening dyspnea with intermittent episodes of sharp exertional chest pain.  Patient had not been taking cardiac medications.  Echo September 2022 showed EF 45 to 50% as detailed above.  Nuclear stress test showed fixed perfusion defect as detailed above.  Patient was last seen at [facility] by Vincent So, PA-C on 02/04/2021 for routine follow up. He was doing well at that time and no changes were made.   Patient presented to the ED on 03/05/2023 for chest tightness and exertional shortness of breath for several months. He has not followed up with PCP or cardiology secondary to lack of insurance. He also reported not taking any medications for a couple of years. Initial labs: WBC 6.6, HGB 15.8, sodium 138, potassium 4.1, creatinine 1.16, BUN 15, BNP 58.2. Troponin was negative. EKG without acute changes. Patient was discharged on Zetia, Toprol, albuterol, and hydrochlorothiazide. He was referred to cardiology.    Patient was last seen in the office by me on 03/09/2023 for hospital follow-up.  He reported continued exertional chest tightness/pressure progressively worsening over the last year with associated dyspnea.  He had been lost to follow-up and off medications since August 2023.     History of Present Illness    Today, patient is accompanied by his wife. Patient denies lower extremity edema or PND. He has stable orthopnea and sleeps in a recliner for comfort and improved breathing. No palpitations.  He reports rare episodes of chest pain since last visit. His wife is concerned  that he seems very fatigued. She reports he is sleeping a lot. He feels there is a degree of laziness contributing to this. 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 He agrees but states once he gets down there and starts working he feels fine. His  biggest concern today is bilateral claudication. He experiences bilateral hip pain with pain down back of legs when he walks that resolves when he rests. This has gotten to the point that he has to ride the cart in [employer] because he cannot tolerate walking the store. Discussed all his recent testing in detail. All questions answered.     ROS: All other systems reviewed and are otherwise negative except as noted in History of Present Illness.  EKGs/Labs Reviewed        03/29/2023: ALT 30; AST 17; BUN 17; Creatinine, Ser 1.08; Potassium 4.4; Sodium 138   03/29/2023: Hemoglobin 15.3; WBC 7.5   03/29/2023: TSH 1.79   03/05/2023: B Natriuretic Peptide 58.2    Physical Exam    VS:  BP 112/77 (BP Location: Left Arm, Patient Position: Sitting)   Pulse 70   Ht 5\' 4"  (1.626 m)   Wt 170 lb 9.6 oz (77.4 kg)   SpO2 96%   BMI 29.28 kg/m  , BMI Body mass index is 29.28 kg/m.  GEN: Well nourished, well developed, in no acute distress. Neck: No JVD or carotid bruits. Cardiac:  RRR. No murmurs. No rubs or gallops.   Respiratory:  Respirations regular and unlabored. Clear to auscultation without rales, wheezing or rhonchi. GI: Soft, nontender, nondistended. Extremities: Radials 2+ and equal bilaterally. DP/PT 1+ and equal bilaterally. No clubbing or cyanosis. No edema.  Skin: Warm and dry, no rash. Neuro: Strength intact.  Assessment & Plan   CAD S/p CABG x 31 December 2013.  Nuclear stress test March 2025 was an intermediate risk study with no evidence of ischemia and consistent with prior infarction.  Patient has not been on cardiac medications since August 2023. He was restarted on medications in the ED February 2025.  Patient reports rare episodes of chest pain. He has not needed prn NTG. He is somewhat sedentary. He does some work in shop and states once he gets going on a project he does fine.   -Continue Toprol, aspirin, atorvastatin, Zetia, prn SL NTG.   Chronic systolic heart  failure/ischemic cardiomyopathy Echo March 2025 showed EF 55 to 60%, no RWMA, LVH, Grade I DD, normal RV size/function, normal PA pressure, borderline dilatation of aortic root 38 mm.  Patient denies lower extremity edema or PND.  Euvolemic and well compensated on exam.  -Continue Toprol. GDMT limited secondary to soft BP.    AAA AAA duplex March 2025 showed dilated infrarenal fusiform dilatation 2.7 cm (smaller than September 2022 when measured at 3.1 cm). Patient denies abdominal pain or back pain.  -Repeat AAA [place] in March 2026.   Carotid artery stenosis Carotid duplex March 2025 showed right ICA 40 to 59%, left ICA 1 to 39%. Patient denies dizziness, presyncope or syncope.  -Repeat carotid duplex in March 2026.   Hypertension BP today 112/77 (has not taken medications this AM).  Home BP in this range. Has gotten SBP readings in the 90s and felt lightheaded. He also reports fatigue.  -Continue Toprol. -Decrease hydrochlorothiazide to 12.5 mg daily.     Hyperlipidemia  LDL 91 February 2025. -Continue atorvastatin and Zetia.  Claudication Patient reports bilateral hip pain radiating  down back of legs with walking that resolves with rest. It has gotten [name] bad that he has to ride the cart instead of walk through [place].  -ABI and LEA [place].    Tobacco abuse Patient continues to not smoke.  -Continued cessation encouraged.   Disposition: ABI and LEA [place]. Return in 3 months. Will have patient establish with Dr. Kirke Corin given history of CAD and PAD.          Signed, [name]. Vincent Vale, DNP, NP-C

## 2023-05-10 ENCOUNTER — Ambulatory Visit: Payer: No Typology Code available for payment source | Attending: Student | Admitting: Student

## 2023-05-10 ENCOUNTER — Encounter: Payer: Self-pay | Admitting: Student

## 2023-05-10 VITALS — BP 112/77 | HR 70 | Ht 64.0 in | Wt 170.6 lb

## 2023-05-10 DIAGNOSIS — I255 Ischemic cardiomyopathy: Secondary | ICD-10-CM

## 2023-05-10 DIAGNOSIS — I714 Abdominal aortic aneurysm, without rupture, unspecified: Secondary | ICD-10-CM | POA: Diagnosis not present

## 2023-05-10 DIAGNOSIS — I10 Essential (primary) hypertension: Secondary | ICD-10-CM

## 2023-05-10 DIAGNOSIS — I5032 Chronic diastolic (congestive) heart failure: Secondary | ICD-10-CM | POA: Diagnosis not present

## 2023-05-10 DIAGNOSIS — I25110 Atherosclerotic heart disease of native coronary artery with unstable angina pectoris: Secondary | ICD-10-CM | POA: Diagnosis not present

## 2023-05-10 DIAGNOSIS — I6523 Occlusion and stenosis of bilateral carotid arteries: Secondary | ICD-10-CM

## 2023-05-10 DIAGNOSIS — I739 Peripheral vascular disease, unspecified: Secondary | ICD-10-CM | POA: Diagnosis not present

## 2023-05-10 DIAGNOSIS — F17201 Nicotine dependence, unspecified, in remission: Secondary | ICD-10-CM

## 2023-05-10 MED ORDER — EZETIMIBE 10 MG PO TABS: 10.0000 mg | ORAL_TABLET | Freq: Every day | ORAL | 3 refills | Status: AC

## 2023-05-10 MED ORDER — HYDROCHLOROTHIAZIDE 12.5 MG PO TABS: 12.5000 mg | ORAL_TABLET | Freq: Every day | ORAL | 3 refills | Status: DC

## 2023-05-10 MED ORDER — METOPROLOL SUCCINATE ER 25 MG PO TB24: 25.0000 mg | ORAL_TABLET | Freq: Every day | ORAL | 3 refills | Status: DC

## 2023-05-10 MED ORDER — ATORVASTATIN CALCIUM 40 MG PO TABS: 40.0000 mg | ORAL_TABLET | Freq: Every day | ORAL | 3 refills | Status: DC

## 2023-05-10 NOTE — Patient Instructions (Signed)
 Medication Instructions:  Your physician recommends the following medication changes.  DECREASE: Hydrochlorothiazide (HCTZ) 12.5 mg daily   *If you need a refill on your cardiac medications before your next appointment, please call your pharmacy*  Lab Work: None ordered at this time   Testing/Procedures: Your physician has requested that you have a lower extremity arterial duplex. During this test, ultrasound is used to evaluate arterial blood flow in the legs. Allow one hour for this exam. There are no restrictions or special instructions. This will take place at [address] [address], [place] [address]   Your physician has requested that you have an ankle brachial index (ABI). During this test an ultrasound and blood pressure cuff are used to evaluate the arteries that supply the arms and legs with blood.  Allow thirty minutes for this exam.  There are no restrictions or special instructions.  This will take place at [address] [facility] [address], [place] [address]  Please note: We ask at that you not bring children with you during ultrasound (echo/ vascular) testing. Due to room size and safety concerns, children are not allowed in the ultrasound rooms during exams. Our front office staff cannot provide observation of children in our lobby area while testing is being conducted. An adult accompanying a patient to their appointment will only be allowed in the ultrasound room at the discretion of the ultrasound technician under special circumstances. We apologize for any inconvenience.   Follow-Up: At [facility], you and your health needs are our priority.  As part of our continuing mission to provide you with exceptional heart care, our providers are all part of one team.  This team includes your primary Cardiologist (physician) and Advanced Practice Providers or APPs (Physician Assistants and Nurse Practitioners) who all work together  to provide you with the care you need, when you need it.  Your next appointment:   3 month(s)  Provider:   You may see Lorine Bears, MD or Carlos Levering, NP

## 2023-05-15 ENCOUNTER — Telehealth: Payer: Self-pay

## 2023-05-15 NOTE — Telephone Encounter (Signed)
 Copied from CRM [id number]. Topic: Appointments - Scheduling Inquiry for Clinic >> May 14, 2023  1:15 PM Margarette Shawl wrote: Reason for CRM:   Patient's wife, Eveleen Hinds, is calling to schedule the lung cancer screening.   CB#  [phone](Edna's cell)   [phone](landline, in case not avail on cellphone)  Would prefer [place] location if possible. Patients providers are all located in same location.  Spoke with patient's wife Eveleen Hinds regarding prior message. Aubry League I will sent the message to mychart for him to make a visit with the kung cancer screening. Edna's voice was understanding. Nothing else further needed.

## 2023-06-07 ENCOUNTER — Ambulatory Visit: Attending: Student

## 2023-06-07 ENCOUNTER — Ambulatory Visit (INDEPENDENT_AMBULATORY_CARE_PROVIDER_SITE_OTHER)

## 2023-06-07 ENCOUNTER — Ambulatory Visit: Admitting: Cardiovascular Disease

## 2023-06-07 DIAGNOSIS — I739 Peripheral vascular disease, unspecified: Secondary | ICD-10-CM

## 2023-06-10 LAB — VAS US ABI WITH/WO TBI
Left ABI: 0.91
Right ABI: 1.11

## 2023-06-12 ENCOUNTER — Ambulatory Visit: Payer: Self-pay | Admitting: Emergency Medicine

## 2023-06-14 NOTE — Progress Notes (Unsigned)
 Cardiology Office Note   Date:  06/15/2023   ID:  [name], DOB Jan 07, 1961, MRN [id number]  PCP:  Dorothe Gaster, NP  Cardiologist:   Antionette Kirks, MD   No chief complaint on file.     History of Present Illness: [name] is a 63 y.o. male who was referred by Levin Reamer for evaluation and management of peripheral arterial disease. He has known history of coronary artery disease status post CABG in 2015, chronic systolic heart failure with mildly reduced LV systolic function, small abdominal aortic aneurysm, carotid disease, essential hypertension, hyperlipidemia and previous tobacco use. He was seen in February for exertional chest pain and shortness of breath.  He underwent a Lexiscan  Myoview  which showed evidence of prior inferolateral infarct without significant ischemia.  Echocardiogram showed normal LV systolic function with no significant valvular abnormalities.  He was without his cardiac medications which were resumed at that time. He quit smoking in December of last year. He reports severe bilateral hip and leg discomfort with minimal walking.  This started 2 to 3 years ago but has been progressive to the point where he cannot walk in the store anymore and he has a motorized cart.  No rest pain or lower extremity ulceration.  Symptoms are equal in both sides. He reports minimal chest pain.  He does have chronic exertional dyspnea and wonders if he has a component of COPD.  He underwent lower extremity arterial Doppler which showed mildly reduced ABI on the left side at 0.91.  Duplex showed significant bilateral common iliac artery disease with significant stenosis in the mid left external iliac artery as well.  Past Medical History:  Diagnosis Date   Anginal pain (HCC)    Anxiety    Hyperlipidemia    Hypertension    Myocardial infarction [facility])    Neuropathy     Past Surgical History:  Procedure Laterality Date   COLONOSCOPY     CORONARY ARTERY  BYPASS GRAFT N/A 01/07/2014   Procedure: CORONARY ARTERY BYPASS GRAFTING (CABG);  Surgeon: Norita Beauvais, MD;  Location: [facility] OR;  Service: Open Heart Surgery;  Laterality: N/A;  Coronary Artery Bypass Graft times two with internal mammary artery to Left Anterior Descending Coronary Artery and SVG to Distal Right Coronary Artery   FOOT SURGERY     right   REFRACTIVE SURGERY Bilateral 2021   TEE WITHOUT CARDIOVERSION N/A 01/07/2014   Procedure: TRANSESOPHAGEAL ECHOCARDIOGRAM (TEE);  Surgeon: Norita Beauvais, MD;  Location: [facility] OR;  Service: Open Heart Surgery;  Laterality: N/A;     Current Outpatient Medications  Medication Sig Dispense Refill   acetaminophen  (TYLENOL ) 500 MG tablet Take 1,000 mg by mouth every 6 (six) hours as needed.     albuterol  (VENTOLIN  HFA) 108 (90 Base) MCG/ACT inhaler Inhale 2 puffs into the lungs every 6 (six) hours as needed for wheezing or shortness of breath. 8 g 2   aspirin  EC 81 MG tablet Take 1 tablet (81 mg total) by mouth daily. Swallow whole.     atorvastatin  (LIPITOR) 40 MG tablet Take 1 tablet (40 mg total) by mouth daily. 90 tablet 3   clopidogrel (PLAVIX) 75 MG tablet Take 1 tablet (75 mg total) by mouth daily. 90 tablet 3   ezetimibe  (ZETIA ) 10 MG tablet Take 1 tablet (10 mg total) by mouth daily. 90 tablet 3   hydrochlorothiazide  (HYDRODIURIL ) 12.5 MG tablet Take 1 tablet (12.5 mg total) by mouth daily. 90 tablet 3  metoprolol  succinate (TOPROL  XL) 25 MG 24 hr tablet Take 1 tablet (25 mg total) by mouth daily. 90 tablet 3   naproxen sodium (ALEVE) 220 MG tablet Take 220 mg by mouth 2 (two) times daily as needed.     traZODone  (DESYREL ) 50 MG tablet TAKE 1/2 TO 1 TABLETS BY MOUTH AT BEDTIME AS NEEDED. 30 tablet 1   nitroGLYCERIN  (NITROSTAT ) 0.4 MG SL tablet Place 1 tablet (0.4 mg total) under the tongue every 5 (five) minutes as needed for chest pain. 90 tablet 3   No current facility-administered medications for this visit.    Allergies:    Sertraline  hcl    Social History:  The patient  reports that he quit smoking about 6 months ago. His smoking use included cigarettes. He has a 12.5 pack-year smoking history. He has never used smokeless tobacco. He reports current drug use. Drug: Marijuana. He reports that he does not drink alcohol.   Family History:  The patient's family history includes Diabetes in his father and sister; Mental illness in his sister.    ROS:  Please see the history of present illness.   Otherwise, review of systems are positive for none.   All other systems are reviewed and negative.    PHYSICAL EXAM: VS:  BP 112/64 (BP Location: Left Arm, Patient Position: Sitting, Cuff Size: Normal)   Pulse 76   Ht 5\' 4"  (1.626 m)   Wt 167 lb 12.8 oz (76.1 kg)   SpO2 96%   BMI 28.80 kg/m  , BMI Body mass index is 28.8 kg/m. GEN: Well nourished, well developed, in no acute distress  HEENT: normal  Neck: no JVD,  or masses.  Bilateral carotid bruits Cardiac: RRR; no murmurs, rubs, or gallops,no edema  Respiratory:  clear to auscultation bilaterally, normal work of breathing GI: soft, nontender, nondistended, + BS MS: no deformity or atrophy  Skin: warm and dry, no rash Neuro:  Strength and sensation are intact Psych: euthymic mood, full affect Vascular: Femoral pulse is +1 on the right and barely palpable on the left.  Distal pulses are +1 on the right and not palpable on the left.   EKG:  EKG is not ordered today.    Recent Labs: 03/05/2023: B Natriuretic Peptide 58.2 03/29/2023: ALT 30; BUN 17; Creatinine, Ser 1.08; Hemoglobin 15.3; Platelets 200.0; Potassium 4.4; Sodium 138; TSH 1.79    Lipid Panel    Component Value Date/Time   CHOL 151 03/29/2023 1444   CHOL 139 02/14/2021 0755   TRIG 57.0 03/29/2023 1444   HDL 48.60 03/29/2023 1444   HDL 48 02/14/2021 0755   CHOLHDL 3 03/29/2023 1444   VLDL 11.4 03/29/2023 1444   LDLCALC 91 03/29/2023 1444   LDLCALC 82 02/14/2021 0755      Wt Readings  from Last 3 Encounters:  06/15/23 167 lb 12.8 oz (76.1 kg)  05/10/23 170 lb 9.6 oz (77.4 kg)  04/27/23 173 lb (78.5 kg)           No data to display            ASSESSMENT AND PLAN:  1.  Peripheral arterial disease with severe bilateral leg claudication Rutherford class III: I discussed the natural history and management of peripheral arterial disease.  I suggested trying an exercise program but he has not been able to walk even short distance without having to stop and rest.  He seems to have bilateral iliac disease that is worse on the left side.  Given  severity of his symptoms, recommend proceeding with abdominal aortogram with lower extremity angiography and possible endovascular intervention.  I discussed the procedure in details as well as risks and benefits.  Planned access is via the right common femoral artery.  I added clopidogrel 75 mg once daily.  2.  Coronary artery disease involving native coronary arteries with stable angina: He reports improvement in symptoms since his medications were resumed.  He had a recent nuclear stress test that showed no evidence of ischemia.  3.  Chronic systolic heart failure with improved ejection fraction: Most recent echocardiogram showed normal EF.  No evidence of volume overload.  4.  Abdominal aortic aneurysm: This was small in size at 2.7 cm on most recent evaluation.  5.  Essential hypertension: Blood pressure is controlled on current medications.  6.  Hyperlipidemia: Currently on atorvastatin  and ezetimibe .  Most recent LDL was 91.  Given his extensive cardiovascular disease, recommend a target LDL of less than 55.  We will have to consider adding a PCSK9 inhibitor.  7.  Carotid stenosis: He has bilateral carotid bruits.  Most recent carotid Doppler in March showed 40 to 59% stenosis on the right side and less than 40% stenosis on the left side.  Repeat study in March 2026.    Disposition:   Proceed with angiography and follow-up  after.  Signed,  Antionette Kirks, MD  06/15/2023 12:28 PM    [facility]

## 2023-06-14 NOTE — H&P (View-Only) (Signed)
 Cardiology Office Note   Date:  06/15/2023   ID:  [name], DOB 1961/01/11, MRN [id number]  PCP:  Dorothe Gaster, NP  Cardiologist:   Antionette Kirks, MD   No chief complaint on file.     History of Present Illness: [name] is a 63 y.o. male who was referred by Levin Reamer for evaluation and management of peripheral arterial disease. He has known history of coronary artery disease status post CABG in 2015, chronic systolic heart failure with mildly reduced LV systolic function, small abdominal aortic aneurysm, carotid disease, essential hypertension, hyperlipidemia and previous tobacco use. He was seen in February for exertional chest pain and shortness of breath.  He underwent a Lexiscan  Myoview  which showed evidence of prior inferolateral infarct without significant ischemia.  Echocardiogram showed normal LV systolic function with no significant valvular abnormalities.  He was without his cardiac medications which were resumed at that time. He quit smoking in December of last year. He reports severe bilateral hip and leg discomfort with minimal walking.  This started 2 to 3 years ago but has been progressive to the point where he cannot walk in the store anymore and he has a motorized cart.  No rest pain or lower extremity ulceration.  Symptoms are equal in both sides. He reports minimal chest pain.  He does have chronic exertional dyspnea and wonders if he has a component of COPD.  He underwent lower extremity arterial Doppler which showed mildly reduced ABI on the left side at 0.91.  Duplex showed significant bilateral common iliac artery disease with significant stenosis in the mid left external iliac artery as well.  Past Medical History:  Diagnosis Date   Anginal pain (HCC)    Anxiety    Hyperlipidemia    Hypertension    Myocardial infarction [facility])    Neuropathy     Past Surgical History:  Procedure Laterality Date   COLONOSCOPY     CORONARY ARTERY  BYPASS GRAFT N/A 01/07/2014   Procedure: CORONARY ARTERY BYPASS GRAFTING (CABG);  Surgeon: Norita Beauvais, MD;  Location: [facility] OR;  Service: Open Heart Surgery;  Laterality: N/A;  Coronary Artery Bypass Graft times two with internal mammary artery to Left Anterior Descending Coronary Artery and SVG to Distal Right Coronary Artery   FOOT SURGERY     right   REFRACTIVE SURGERY Bilateral 2021   TEE WITHOUT CARDIOVERSION N/A 01/07/2014   Procedure: TRANSESOPHAGEAL ECHOCARDIOGRAM (TEE);  Surgeon: Norita Beauvais, MD;  Location: [facility] OR;  Service: Open Heart Surgery;  Laterality: N/A;     Current Outpatient Medications  Medication Sig Dispense Refill   acetaminophen  (TYLENOL ) 500 MG tablet Take 1,000 mg by mouth every 6 (six) hours as needed.     albuterol  (VENTOLIN  HFA) 108 (90 Base) MCG/ACT inhaler Inhale 2 puffs into the lungs every 6 (six) hours as needed for wheezing or shortness of breath. 8 g 2   aspirin  EC 81 MG tablet Take 1 tablet (81 mg total) by mouth daily. Swallow whole.     atorvastatin  (LIPITOR) 40 MG tablet Take 1 tablet (40 mg total) by mouth daily. 90 tablet 3   clopidogrel  (PLAVIX ) 75 MG tablet Take 1 tablet (75 mg total) by mouth daily. 90 tablet 3   ezetimibe  (ZETIA ) 10 MG tablet Take 1 tablet (10 mg total) by mouth daily. 90 tablet 3   hydrochlorothiazide  (HYDRODIURIL ) 12.5 MG tablet Take 1 tablet (12.5 mg total) by mouth daily. 90 tablet 3  metoprolol  succinate (TOPROL  XL) 25 MG 24 hr tablet Take 1 tablet (25 mg total) by mouth daily. 90 tablet 3   naproxen sodium (ALEVE) 220 MG tablet Take 220 mg by mouth 2 (two) times daily as needed.     traZODone  (DESYREL ) 50 MG tablet TAKE 1/2 TO 1 TABLETS BY MOUTH AT BEDTIME AS NEEDED. 30 tablet 1   nitroGLYCERIN  (NITROSTAT ) 0.4 MG SL tablet Place 1 tablet (0.4 mg total) under the tongue every 5 (five) minutes as needed for chest pain. 90 tablet 3   No current facility-administered medications for this visit.    Allergies:    Sertraline  hcl    Social History:  The patient  reports that he quit smoking about 6 months ago. His smoking use included cigarettes. He has a 12.5 pack-year smoking history. He has never used smokeless tobacco. He reports current drug use. Drug: Marijuana. He reports that he does not drink alcohol.   Family History:  The patient's family history includes Diabetes in his father and sister; Mental illness in his sister.    ROS:  Please see the history of present illness.   Otherwise, review of systems are positive for none.   All other systems are reviewed and negative.    PHYSICAL EXAM: VS:  BP 112/64 (BP Location: Left Arm, Patient Position: Sitting, Cuff Size: Normal)   Pulse 76   Ht 5\' 4"  (1.626 m)   Wt 167 lb 12.8 oz (76.1 kg)   SpO2 96%   BMI 28.80 kg/m  , BMI Body mass index is 28.8 kg/m. GEN: Well nourished, well developed, in no acute distress  HEENT: normal  Neck: no JVD,  or masses.  Bilateral carotid bruits Cardiac: RRR; no murmurs, rubs, or gallops,no edema  Respiratory:  clear to auscultation bilaterally, normal work of breathing GI: soft, nontender, nondistended, + BS MS: no deformity or atrophy  Skin: warm and dry, no rash Neuro:  Strength and sensation are intact Psych: euthymic mood, full affect Vascular: Femoral pulse is +1 on the right and barely palpable on the left.  Distal pulses are +1 on the right and not palpable on the left.   EKG:  EKG is not ordered today.    Recent Labs: 03/05/2023: B Natriuretic Peptide 58.2 03/29/2023: ALT 30; BUN 17; Creatinine, Ser 1.08; Hemoglobin 15.3; Platelets 200.0; Potassium 4.4; Sodium 138; TSH 1.79    Lipid Panel    Component Value Date/Time   CHOL 151 03/29/2023 1444   CHOL 139 02/14/2021 0755   TRIG 57.0 03/29/2023 1444   HDL 48.60 03/29/2023 1444   HDL 48 02/14/2021 0755   CHOLHDL 3 03/29/2023 1444   VLDL 11.4 03/29/2023 1444   LDLCALC 91 03/29/2023 1444   LDLCALC 82 02/14/2021 0755      Wt Readings  from Last 3 Encounters:  06/15/23 167 lb 12.8 oz (76.1 kg)  05/10/23 170 lb 9.6 oz (77.4 kg)  04/27/23 173 lb (78.5 kg)           No data to display            ASSESSMENT AND PLAN:  1.  Peripheral arterial disease with severe bilateral leg claudication Rutherford class III: I discussed the natural history and management of peripheral arterial disease.  I suggested trying an exercise program but he has not been able to walk even short distance without having to stop and rest.  He seems to have bilateral iliac disease that is worse on the left side.  Given  severity of his symptoms, recommend proceeding with abdominal aortogram with lower extremity angiography and possible endovascular intervention.  I discussed the procedure in details as well as risks and benefits.  Planned access is via the right common femoral artery.  I added clopidogrel  75 mg once daily.  2.  Coronary artery disease involving native coronary arteries with stable angina: He reports improvement in symptoms since his medications were resumed.  He had a recent nuclear stress test that showed no evidence of ischemia.  3.  Chronic systolic heart failure with improved ejection fraction: Most recent echocardiogram showed normal EF.  No evidence of volume overload.  4.  Abdominal aortic aneurysm: This was small in size at 2.7 cm on most recent evaluation.  5.  Essential hypertension: Blood pressure is controlled on current medications.  6.  Hyperlipidemia: Currently on atorvastatin  and ezetimibe .  Most recent LDL was 91.  Given his extensive cardiovascular disease, recommend a target LDL of less than 55.  We will have to consider adding a PCSK9 inhibitor.  7.  Carotid stenosis: He has bilateral carotid bruits.  Most recent carotid Doppler in March showed 40 to 59% stenosis on the right side and less than 40% stenosis on the left side.  Repeat study in March 2026.    Disposition:   Proceed with angiography and follow-up  after.  Signed,  Antionette Kirks, MD  06/15/2023 12:28 PM    [facility]

## 2023-06-15 ENCOUNTER — Ambulatory Visit: Attending: Cardiovascular Disease | Admitting: Cardiovascular Disease

## 2023-06-15 VITALS — BP 112/64 | HR 76 | Ht 64.0 in | Wt 167.8 lb

## 2023-06-15 DIAGNOSIS — I10 Essential (primary) hypertension: Secondary | ICD-10-CM

## 2023-06-15 DIAGNOSIS — I25110 Atherosclerotic heart disease of native coronary artery with unstable angina pectoris: Secondary | ICD-10-CM

## 2023-06-15 DIAGNOSIS — I5022 Chronic systolic (congestive) heart failure: Secondary | ICD-10-CM

## 2023-06-15 DIAGNOSIS — I739 Peripheral vascular disease, unspecified: Secondary | ICD-10-CM

## 2023-06-15 DIAGNOSIS — E785 Hyperlipidemia, unspecified: Secondary | ICD-10-CM

## 2023-06-15 DIAGNOSIS — I779 Disorder of arteries and arterioles, unspecified: Secondary | ICD-10-CM

## 2023-06-15 MED ORDER — CLOPIDOGREL BISULFATE 75 MG PO TABS: 75.0000 mg | ORAL_TABLET | Freq: Every day | ORAL | 3 refills | Status: AC

## 2023-06-15 NOTE — Patient Instructions (Signed)
 Medication Instructions:  START Clopidogrel (Plavix) 75 mg once daily  *If you need a refill on your cardiac medications before your next appointment, please call your pharmacy*  Lab Work: Your provider would like for you to have the following labs today: CBC and BMET  If you have labs (blood work) drawn today and your tests are completely normal, you will receive your results only by: MyChart Message (if you have MyChart) OR A paper copy in the mail If you have any lab test that is abnormal or we need to change your treatment, we will call you to review the results.  Testing/Procedures: Your physician has requested that you have a peripheral vascular angiogram. This exam is performed at the hospital. During this exam IV contrast is used to look at arterial blood flow. Please review the information sheet given for details.   Follow-Up: At [facility], you and your health needs are our priority.  As part of our continuing mission to provide you with exceptional heart care, our providers are all part of one team.  This team includes your primary Cardiologist (physician) and Advanced Practice Providers or APPs (Physician Assistants and Nurse Practitioners) who all work together to provide you with the care you need, when you need it.  Your next appointment:   Follow up in 4 weeks with Dr. Alvenia Aus and APP  We recommend signing up for the patient portal called "MyChart".  Sign up information is provided on this After Visit Summary.  MyChart is used to connect with patients for Virtual Visits (Telemedicine).  Patients are able to view lab/test results, encounter notes, upcoming appointments, etc.  Non-urgent messages can be sent to your provider as well.   To learn more about what you can do with MyChart, go to [email or web address].   Other Instructions  [name] [address] [name]  [address] [place] [address] Dept: [phone] Loc: [phone]  [name]  06/15/2023  You are scheduled for a Peripheral Angiogram on Wednesday, May 21 with Dr. Antionette Kirks.  1. Please arrive at the [facility] (Main Entrance A) at [facility]: [address] [place], [place] [address] at 12:30 PM (This time is 2 hour(s) before your procedure to ensure your preparation).   Free valet parking service is available. You will check in at ADMITTING. The support person will be asked to wait in the waiting room.  It is OK to have someone drop you off and come back when you are ready to be discharged.    Special note: Every effort is made to have your procedure done on time. Please understand that emergencies sometimes delay scheduled procedures.  2. Diet: Do not eat solid foods 6 hours prior to the test.  The patient may have clear liquids until 6 hours prior to the procedure.  3. Labs: You will need to have blood drawn on 06/15/23.You do not need to be fasting.  4. Medication instructions in preparation for your procedure: Hold the Hydrochlorothiazide  the morning of the test  On the morning of your procedure, take your Aspirin  81 mg and Plavix/Clopidogrel and any morning medicines NOT listed above.  You may use sips of water .  5. Plan to go home the same day, you will only stay overnight if medically necessary. 6. Bring a current list of your medications and current insurance cards. 7. You MUST have a responsible person to drive  you home. 8. Someone MUST be with you the first 24 hours after you arrive home or your discharge will be delayed. 9. Please wear clothes that are easy to get on and off and wear slip-on shoes.  Thank you for allowing us  to care for you!   -- [facility]

## 2023-06-16 LAB — CBC
Hematocrit: 46.3 % (ref 37.5–51.0)
Hemoglobin: 15.6 g/dL (ref 13.0–17.7)
MCH: 29.7 pg (ref 26.6–33.0)
MCHC: 33.7 g/dL (ref 31.5–35.7)
MCV: 88 fL (ref 79–97)
Platelets: 185 10*3/uL (ref 150–450)
RBC: 5.25 x10E6/uL (ref 4.14–5.80)
RDW: 13.5 % (ref 11.6–15.4)
WBC: 8.1 10*3/uL (ref 3.4–10.8)

## 2023-06-16 LAB — BASIC METABOLIC PANEL WITH GFR
BUN/Creatinine Ratio: 15 (ref 10–24)
BUN: 15 mg/dL (ref 8–27)
CO2: 21 mmol/L (ref 20–29)
Calcium: 9.4 mg/dL (ref 8.6–10.2)
Chloride: 98 mmol/L (ref 96–106)
Creatinine, Ser: 1 mg/dL (ref 0.76–1.27)
Glucose: 109 mg/dL — ABNORMAL HIGH (ref 70–99)
Potassium: 4.5 mmol/L (ref 3.5–5.2)
Sodium: 139 mmol/L (ref 134–144)
eGFR: 84 mL/min/{1.73_m2} (ref >59)

## 2023-06-18 ENCOUNTER — Ambulatory Visit: Payer: Self-pay | Admitting: Cardiovascular Disease

## 2023-06-19 ENCOUNTER — Telehealth: Payer: Self-pay | Admitting: *Deleted

## 2023-06-19 NOTE — Telephone Encounter (Signed)
 LEA  scheduled at [facility] for: Wednesday Jun 20, 2023 2:30 PM Arrival time [facility] Main Entrance A at: 12:30 PM  Diet: Do not eat solid foods 6 hours prior to the test.  The patient may have clear liquids until 6 hours prior to the procedure.          See instructions 06/15/23.   Medication instructions: -Hold:  Hydrochlorothiazide -AM of procedure -Other usual morning medications can be taken with sips of water  including aspirin  81 mg.  Plan to go home the same day, you will only stay overnight if medically necessary.  You must have responsible adult to drive you home.  Someone must be with you the first 24 hours after you arrive home.  Reviewed procedure instructions with patient's wife (DPR), Eveleen Hinds.

## 2023-06-20 ENCOUNTER — Ambulatory Visit (HOSPITAL_COMMUNITY)
Admission: RE | Admit: 2023-06-20 | Discharge: 2023-06-20 | Disposition: A | Discharge status: Home / Self Care | Attending: Cardiovascular Disease | Admitting: Cardiovascular Disease

## 2023-06-20 ENCOUNTER — Encounter (HOSPITAL_COMMUNITY)
Admission: RE | Disposition: A | Discharge status: Home / Self Care | Payer: Self-pay | Source: Home / Self Care | Attending: Cardiovascular Disease

## 2023-06-20 ENCOUNTER — Other Ambulatory Visit: Payer: Self-pay

## 2023-06-20 ENCOUNTER — Encounter (HOSPITAL_COMMUNITY): Payer: Self-pay | Admitting: Cardiovascular Disease

## 2023-06-20 DIAGNOSIS — I25118 Atherosclerotic heart disease of native coronary artery with other forms of angina pectoris: Secondary | ICD-10-CM | POA: Diagnosis not present

## 2023-06-20 DIAGNOSIS — I7143 Infrarenal abdominal aortic aneurysm, without rupture: Secondary | ICD-10-CM | POA: Insufficient documentation

## 2023-06-20 DIAGNOSIS — I70213 Atherosclerosis of native arteries of extremities with intermittent claudication, bilateral legs: Secondary | ICD-10-CM | POA: Diagnosis present

## 2023-06-20 DIAGNOSIS — I70212 Atherosclerosis of native arteries of extremities with intermittent claudication, left leg: Secondary | ICD-10-CM

## 2023-06-20 DIAGNOSIS — E785 Hyperlipidemia, unspecified: Secondary | ICD-10-CM | POA: Insufficient documentation

## 2023-06-20 DIAGNOSIS — Z7902 Long term (current) use of antithrombotics/antiplatelets: Secondary | ICD-10-CM | POA: Insufficient documentation

## 2023-06-20 DIAGNOSIS — I5022 Chronic systolic (congestive) heart failure: Secondary | ICD-10-CM | POA: Diagnosis not present

## 2023-06-20 DIAGNOSIS — I708 Atherosclerosis of other arteries: Secondary | ICD-10-CM | POA: Diagnosis not present

## 2023-06-20 DIAGNOSIS — Z87891 Personal history of nicotine dependence: Secondary | ICD-10-CM | POA: Insufficient documentation

## 2023-06-20 DIAGNOSIS — Z79899 Other long term (current) drug therapy: Secondary | ICD-10-CM | POA: Diagnosis not present

## 2023-06-20 DIAGNOSIS — I739 Peripheral vascular disease, unspecified: Secondary | ICD-10-CM

## 2023-06-20 DIAGNOSIS — I110 Hypertensive heart disease with heart failure: Secondary | ICD-10-CM | POA: Diagnosis not present

## 2023-06-20 DIAGNOSIS — I6523 Occlusion and stenosis of bilateral carotid arteries: Secondary | ICD-10-CM | POA: Insufficient documentation

## 2023-06-20 HISTORY — PX: LOWER EXTREMITY INTERVENTION: CATH118252

## 2023-06-20 HISTORY — PX: LOWER EXTREMITY ANGIOGRAPHY: CATH118251

## 2023-06-20 HISTORY — PX: ABDOMINAL AORTOGRAM: CATH118222

## 2023-06-20 LAB — POCT ACTIVATED CLOTTING TIME: Activated Clotting Time: 331 s

## 2023-06-20 SURGERY — LOWER EXTREMITY ANGIOGRAPHY
Anesthesia: LOCAL

## 2023-06-20 MED ORDER — SODIUM CHLORIDE 0.9% FLUSH: 3.0000 mL | Freq: Two times a day (BID) | INTRAVENOUS | Status: DC

## 2023-06-20 MED ORDER — HEPARIN SODIUM (PORCINE) 1000 UNIT/ML IJ SOLN
INTRAMUSCULAR | Status: DC | PRN
  Administered 2023-06-20: 6000 [IU] via INTRAVENOUS

## 2023-06-20 MED ORDER — LIDOCAINE HCL (PF) 1 % IJ SOLN
INTRAMUSCULAR | Status: AC
  Filled 2023-06-20: qty 30

## 2023-06-20 MED ORDER — SODIUM CHLORIDE 0.9% FLUSH: 3.0000 mL | INTRAVENOUS | Status: DC | PRN

## 2023-06-20 MED ORDER — HEPARIN (PORCINE) IN NACL 1000-0.9 UT/500ML-% IV SOLN
INTRAVENOUS | Status: DC | PRN
  Administered 2023-06-20 (×2): 500 mL

## 2023-06-20 MED ORDER — FENTANYL CITRATE (PF) 100 MCG/2ML IJ SOLN
INTRAMUSCULAR | Status: AC
Start: 2023-06-20 — End: ?
  Filled 2023-06-20: qty 2

## 2023-06-20 MED ORDER — ONDANSETRON HCL 4 MG/2ML IJ SOLN: 4.0000 mg | Freq: Four times a day (QID) | INTRAMUSCULAR | Status: DC | PRN

## 2023-06-20 MED ORDER — ACETAMINOPHEN 325 MG PO TABS: 650.0000 mg | ORAL_TABLET | ORAL | Status: DC | PRN

## 2023-06-20 MED ORDER — MIDAZOLAM HCL 2 MG/2ML IJ SOLN
INTRAMUSCULAR | Status: DC | PRN
  Administered 2023-06-20: 1 mg via INTRAVENOUS

## 2023-06-20 MED ORDER — IODIXANOL 320 MG/ML IV SOLN
INTRAVENOUS | Status: DC | PRN
  Administered 2023-06-20: 100 mL

## 2023-06-20 MED ORDER — HEPARIN SODIUM (PORCINE) 1000 UNIT/ML IJ SOLN
INTRAMUSCULAR | Status: AC
  Filled 2023-06-20: qty 10

## 2023-06-20 MED ORDER — LIDOCAINE HCL (PF) 1 % IJ SOLN
INTRAMUSCULAR | Status: DC | PRN
  Administered 2023-06-20: 15 mL

## 2023-06-20 MED ORDER — LABETALOL HCL 5 MG/ML IV SOLN: 10.0000 mg | INTRAVENOUS | Status: DC | PRN

## 2023-06-20 MED ORDER — SODIUM CHLORIDE 0.9 % IV SOLN: INTRAVENOUS | Status: DC

## 2023-06-20 MED ORDER — ASPIRIN 81 MG PO CHEW: 81.0000 mg | CHEWABLE_TABLET | ORAL | Status: DC

## 2023-06-20 MED ORDER — FENTANYL CITRATE (PF) 100 MCG/2ML IJ SOLN
INTRAMUSCULAR | Status: DC | PRN
  Administered 2023-06-20: 50 ug via INTRAVENOUS

## 2023-06-20 MED ORDER — MIDAZOLAM HCL 2 MG/2ML IJ SOLN
INTRAMUSCULAR | Status: AC
  Filled 2023-06-20: qty 2

## 2023-06-20 MED ORDER — SODIUM CHLORIDE 0.9 % IV SOLN: 250.0000 mL | INTRAVENOUS | Status: DC | PRN

## 2023-06-20 SURGICAL SUPPLY — 16 items
BAG SNAP BAND KOVER 36X36 (MISCELLANEOUS) IMPLANT
BALLOON MUSTANG 10X20X75 (BALLOONS) IMPLANT
BALLOON MUSTANG 6.0X40 75 (BALLOONS) IMPLANT
CATH ANGIO 5F PIGTAIL 65CM (CATHETERS) IMPLANT
CATH CROSS OVER TEMPO 5F (CATHETERS) IMPLANT
CLOSURE PERCLOSE PROSTYLE (VASCULAR PRODUCTS) IMPLANT
COVER DOME SNAP 22 D (MISCELLANEOUS) IMPLANT
GLIDEWIRE ADV .035X260CM (WIRE) IMPLANT
KIT ENCORE 26 ADVANTAGE (KITS) IMPLANT
KIT MICROPUNCTURE NIT STIFF (SHEATH) IMPLANT
SET ATX-X65L (MISCELLANEOUS) IMPLANT
SHEATH CATAPULT 7F 45 MP (SHEATH) IMPLANT
SHEATH PINNACLE 5F 10CM (SHEATH) IMPLANT
STENT OMNILINK ELITE 8X59X80 (Permanent Stent) IMPLANT
TRAY PV CATH (CUSTOM PROCEDURE TRAY) ×3 IMPLANT
WIRE HITORQ VERSACORE ST 145CM (WIRE) IMPLANT

## 2023-06-20 NOTE — Discharge Instructions (Addendum)
 Femoral Site Care The following information offers guidance on how to care for yourself after your procedure. Your health care provider may also give you more specific instructions. If you have problems or questions, contact your health care provider. What can I expect after the procedure? After the procedure, it is common to have bruising and tenderness at the incision site. This usually fades within 1-2 weeks. Follow these instructions at home: Incision site care  Follow instructions from your health care provider about how to take care of your incision site. Make sure you: Wash your hands with soap and water for at least 20 seconds before and after you change your bandage (dressing). If soap and water are not available, use hand sanitizer. Remove your dressing in 24 hours. Leave stitches (sutures), skin glue, or adhesive strips in place. These skin closures may need to stay in place for 2 weeks or longer. If adhesive strip edges start to loosen and curl up, you may trim the loose edges. Do not remove adhesive strips completely unless your health care provider tells you to do that. Do not take baths, swim, or use a hot tub for at least 1 week. You may shower 24 hours after the procedure or as told by your health care provider. Gently wash the incision site with plain soap and water. Pat the area dry with a clean towel. Do not rub the site. This may cause bleeding. Do not apply powder or lotion to the site. Keep the site clean and dry. Check your femoral site every day for signs of infection. Check for: Redness, swelling, or pain. Fluid or blood. Warmth. Pus or a bad smell. Activity If you were given a sedative during the procedure, it can affect you for several hours. Do not drive or operate machinery until your health care provider says that it is safe. Rest as told by your health care provider. Avoid sitting for a long time without moving. Get up to take short walks every 1-2 hours. This  is important to improve blood flow and breathing. Ask for help if you feel weak or unsteady. Return to your normal activities as told by your health care provider. Ask your health care provider what activities are safe for you and when you can return to work. Avoid activities that take a lot of effort for the first 2-3 days after your procedure, or as long as directed. Do not lift anything that is heavier than 10 lb (4.5 kg), or the limit that you are told, until your health care provider says that it is safe. General instructions Take over-the-counter and prescription medicines only as told by your health care provider. If you will be going home right after the procedure, plan to have a responsible adult care for you for the time you are told. This is important. Keep all follow-up visits. This is important. Contact a health care provider if: You have a fever or chills. You have any of these signs of infection at your incision site: Redness, swelling, or pain. Fluid or blood. Warmth. Pus or a bad smell. Get help right away if: The incision area swells very fast. The incision area is bleeding, and the bleeding does not stop when you hold steady pressure on the area. Your leg or foot becomes pale, cool, tingly, or numb. These symptoms may represent a serious problem that is an emergency. Do not wait to see if the symptoms will go away. Get medical help right away. Call your local emergency  services (911 in the [place] Do not drive yourself to the hospital. Summary After the procedure, it is common to have bruising and tenderness that fade within 1-2 weeks. Check your femoral site every day for signs of infection. Do not lift anything that is heavier than 10 lb (4.5 kg), or the limit that you are told, until your health care provider says that it is safe. Get help right away if the incision area swells very fast, you have bleeding at the incision area that does not stop, or your leg or foot  becomes pale, cool, or numb. This information is not intended to replace advice given to you by your health care provider. Make sure you discuss any questions you have with your health care provider. Document Revised: 10/06/2020 Document Reviewed: 03/07/2020 Elsevier Patient Education  2024 [employer].

## 2023-06-20 NOTE — Interval H&P Note (Signed)
 History and Physical Interval Note:  06/20/2023 1:54 PM  [name]  has presented today for surgery, with the diagnosis of PAD.  The various methods of treatment have been discussed with the patient and family. After consideration of risks, benefits and other options for treatment, the patient has consented to  Procedure(s): Lower Extremity Angiography (N/A) as a surgical intervention.  The patient's history has been reviewed, patient examined, no change in status, stable for surgery.  I have reviewed the patient's chart and labs.  Questions were answered to the patient's satisfaction.     Jacorion Klem

## 2023-06-27 ENCOUNTER — Telehealth: Payer: Self-pay

## 2023-06-27 ENCOUNTER — Other Ambulatory Visit (HOSPITAL_COMMUNITY): Payer: Self-pay

## 2023-06-27 ENCOUNTER — Ambulatory Visit: Admitting: Nurse Practitioner

## 2023-06-27 VITALS — BP 110/70 | HR 62 | Temp 97.6°F | Ht 64.0 in | Wt 175.6 lb

## 2023-06-27 DIAGNOSIS — F419 Anxiety disorder, unspecified: Secondary | ICD-10-CM

## 2023-06-27 DIAGNOSIS — R42 Dizziness and giddiness: Secondary | ICD-10-CM | POA: Diagnosis not present

## 2023-06-27 DIAGNOSIS — E119 Type 2 diabetes mellitus without complications: Secondary | ICD-10-CM | POA: Diagnosis not present

## 2023-06-27 LAB — POCT GLYCOSYLATED HEMOGLOBIN (HGB A1C): Hemoglobin A1C: 6 % — AB (ref 4.0–5.6)

## 2023-06-27 MED ORDER — FLUOXETINE HCL 20 MG PO TABS: ORAL_TABLET | ORAL | 0 refills | Status: DC

## 2023-06-27 NOTE — Assessment & Plan Note (Signed)
 Patient's A1c trended down from 6.5% to 6.0%.  He is yet to speak to the diabetic educator.  He has worked on his lifestyle modifications inclusive of diet.  Continue working on lifestyle modifications inclusive of diet and exercise.

## 2023-06-27 NOTE — Patient Instructions (Signed)
 Nice to see you today Your A1C was 6.0% that is much improved.  Follow up with me in 6 weeks, sooner if you need me

## 2023-06-27 NOTE — Telephone Encounter (Signed)
 LAST APPOINTMENT DATE: 06/27/2023   NEXT APPOINTMENT DATE: 08/08/2023   FLUoxetine (Prozac) 20 mg  LAST FILL: 06/27/2023  QTY: #23 0RF   10 mg, Daily Starting Wed 06/27/2023, For 14 days, THEN 20 mg, Daily Starting Wed 07/11/2023, For 16 days10 mg, Daily Starting Wed 06/27/2023, For 14 days, THEN 20 mg, Daily Starting Wed 07/11/2023, For 16 days OralPatient Sig: Take 0.5 tablets (10 mg total) by mouth daily for 14 days, THEN 1 tablet (20 mg total) daily for 16 days.

## 2023-06-27 NOTE — Telephone Encounter (Signed)
 Pharmacy Patient Advocate Encounter   Received notification from Pt Calls Messages that prior authorization for Prozac 20 tabs is required/requested.   Insurance verification completed.   The patient is insured through [facility] .   Per test claim:  Prozac capsules is preferred by the insurance.  If suggested medication is appropriate, Please send in a new RX and discontinue this one. If not, please advise as to why it's not appropriate so that we may request a Prior Authorization. Please note, some preferred medications may still require a PA.  If the suggested medications have not been trialed and there are no contraindications to their use, the PA will not be submitted, as it will not be approved.  Prozac 10 capsules has a co-pay $2.48.

## 2023-06-27 NOTE — Assessment & Plan Note (Signed)
 History of the same.  Was tried on sertraline  and had adverse drug event.  Patient self discontinued this was added to his allergy list.  Will try fluoxetine  10 mg daily for 2 weeks then titrate up to 20 mg thereafter.  If patient does not tolerate this medication consider using buspirone.

## 2023-06-27 NOTE — Progress Notes (Signed)
 Established Patient Office Visit  Subjective   [name], male    DOB: [birth year]  Age: 63 y.o. MRN: [id number]  Chief Complaint  Patient presents with   Diabetes   Medication Management    Pt requests 3 month supply on trazodone  for the future.     HPI  DM2: Patient currently maintained on diet lifestyle modifications only.  Patient's last A1c was 6.5%.  He was referred to diabetic educator.  He is here for recheck today. He has not been seen by DM educator. He is doing 2 meals and some snacks. He is not drinking enough He is drinking some water , Diet Dr.pepper and sugar subsiti He has cut down on the sweets   GAD: Patient was seen by me on 04/27/2023 for GAD.  He has tried trazodone  past that did help with sleep only.  He was started on Zoloft  25 mg daily for 2 weeks and then titrated to 50 mg daily.  He is here for follow-up. States that he could not take the zoloft . He took it for 2 days. He was big eyed and shaking. He was borderline crying.  After the episode they stopped the medication.     04/27/2023    2:29 PM 03/29/2023    2:12 PM  PHQ9 SCORE ONLY  PHQ-9 Total Score 6 9       04/27/2023    2:29 PM 04/27/2023    2:15 PM 03/29/2023    2:10 PM  GAD 7 : Generalized Anxiety Score  Nervous, Anxious, on Edge 1 1 3   Control/stop worrying 1 1 3   Worry too much - different things 2 2 3   Trouble relaxing 0 0 3  Restless 0 0 0  Easily annoyed or irritable 1 1 1   Afraid - awful might happen 1 1 1   Total GAD 7 Score 6 6 14   Anxiety Difficulty Somewhat difficult Somewhat difficult Not difficult at all          Review of Systems  Constitutional:  Negative for chills and fever.  Respiratory:  Negative for shortness of breath.   Cardiovascular:  Negative for chest pain.  Neurological:  Positive for dizziness. Negative for headaches.  Psychiatric/Behavioral:  Negative for hallucinations and suicidal ideas.       Objective:     BP 110/70   Pulse 62    Temp 97.6 F (36.4 C) (Oral)   Ht 5\' 4"  (1.626 m)   Wt 175 lb 9.6 oz (79.7 kg)   SpO2 96%   BMI 30.14 kg/m  BP Readings from Last 3 Encounters:  06/27/23 110/70  06/20/23 109/71  06/15/23 112/64   Wt Readings from Last 3 Encounters:  06/27/23 175 lb 9.6 oz (79.7 kg)  06/20/23 170 lb (77.1 kg)  06/15/23 167 lb 12.8 oz (76.1 kg)   SpO2 Readings from Last 3 Encounters:  06/27/23 96%  06/20/23 96%  06/15/23 96%      Physical Exam Vitals and nursing note reviewed.  Constitutional:      Appearance: Normal appearance.  Cardiovascular:     Rate and Rhythm: Normal rate and regular rhythm.     Heart sounds: Normal heart sounds.  Pulmonary:     Effort: Pulmonary effort is normal.     Breath sounds: Normal breath sounds.  Abdominal:     General: Bowel sounds are normal.  Neurological:     Mental Status: He is alert.      Results for orders placed or  performed in visit on 06/27/23  POCT glycosylated hemoglobin (Hb A1C)  Result Value Ref Range   Hemoglobin A1C 6.0 (A) 4.0 - 5.6 %   HbA1c POC (<> result, manual entry)     HbA1c, POC (prediabetic range)     HbA1c, POC (controlled diabetic range)        The 10-year ASCVD risk score (Arnett DK, et al., 2019) is: 16.3%    Assessment & Plan:   Problem List Items Addressed This Visit       Endocrine   Diet-controlled diabetes mellitus (HCC) - Primary   Patient's A1c trended down from 6.5% to 6.0%.  He is yet to speak to the diabetic educator.  He has worked on his lifestyle modifications inclusive of diet.  Continue working on lifestyle modifications inclusive of diet and exercise.      Relevant Orders   POCT glycosylated hemoglobin (Hb A1C) (Completed)     Other   Anxiety   History of the same.  Was tried on sertraline  and had adverse drug event.  Patient self discontinued this was added to his allergy list.  Will try fluoxetine  10 mg daily for 2 weeks then titrate up to 20 mg thereafter.  If patient does not  tolerate this medication consider using buspirone.      Relevant Medications   FLUoxetine  (PROZAC ) 20 MG tablet   Orthostatic dizziness   Patient is on antihypertensives sounds orthostatic in nature.  Patient is not drinking enough fluid.  We did discuss the role of caffeine as diuretic and medical recommendation of 64 ounces of water  a day.  He will continue work on hydration       Return in about 6 weeks (around 08/08/2023) for GAD/med recheck .    Margarie Shay, NP

## 2023-06-27 NOTE — Assessment & Plan Note (Signed)
 Patient is on antihypertensives sounds orthostatic in nature.  Patient is not drinking enough fluid.  We did discuss the role of caffeine as diuretic and medical recommendation of 64 ounces of water  a day.  He will continue work on hydration

## 2023-06-28 MED ORDER — FLUOXETINE HCL 10 MG PO CAPS: ORAL_CAPSULE | ORAL | 0 refills | Status: DC

## 2023-06-28 NOTE — Addendum Note (Signed)
 Addended by: Dorothe Gaster on: 06/28/2023 01:01 PM   Modules accepted: Orders

## 2023-07-11 NOTE — Progress Notes (Signed)
 Cardiology Clinic Note   Date: 07/13/2023 ID: OBI SCRIMA, DOB 05-13-1960, MRN [id number]  Primary Cardiologist:  Antionette Kirks, MD  Chief Complaint   [name] is a 63 y.o. male who presents to the clinic today for follow up after procedure.   Patient Profile   JOHNSTON MADDOCKS is followed by Dr. Alvenia Aus for the history outlined below.      Past medical history significant for: CAD. LHC 01/02/2014 (abnormal stress test): Ostial LAD 100% with collaterals.  Proximal RCA 60%.  Recommend CT surgery consult. CABG x 2 01/07/2014: LIMA to LAD, SVG to RCA. Nuclear stress test 04/02/2023: Intermediate risk study. No evidence of ischemia. There is a medium defect with severe reduction in uptake present in the apical inferior, inferolateral and apex locations that is fixed. There is abnormal wall motion in the defect area consistent with infarction.  Chronic systolic heart failure/ischemic cardiomyopathy.  Echo 04/25/2023: EF 55 to 60%. No RWMA. Severe asymmetric LVH basal-septal segment. Grade I DD. Normal RV size/function. Normal PA pressure, RVSP 29.4. Aortic valve sclerosis without stenosis. Borderline dilatation of aortic root 38 mm.  PAD. Lower extremity arterial ultrasound/ABI 06/07/2023: Right distal SFA 30 to 49% stenosis, right distal common iliac artery 50 to 74% stenosis.  Left distal common iliac artery 50 to 74% stenosis.  Left mid external iliac artery 75 to 99% stenosis. Lower extremity angiography/abdominal aortogram 06/20/2023: Small infrarenal abdominal aortic aneurysm.  Moderate right common iliac artery disease.  Significant stenosis in the distal left common iliac artery of within an aneurysmal segment with severe stenosis in the proximal external iliac artery.  No significant infrainguinal disease on the left.  Successful balloon expandable stent placement to distal left common iliac artery extending into the proximal portion of the external iliac artery.  Recommendation for DAPT  x 6 months with consideration of long-term monotherapy with Plavix  given his vascular disease. AAA. AAA duplex 04/04/2023: Dilated infrarenal fusiform dilatation, 2.7 cm (decreased from previous 3.30 September 2020). Right common iliac >50%, left external iliac >50%.  Carotid artery stenosis. Carotid duplex 04/04/2023: Right ICA 40-59%, ECA <50%. Left ICA 1-39%, ECA >50%. Hypertension.  Hyperlipidemia.  Lipid panel 03/29/2023: LDL 91, HDL 49, TG 57, total 151. T2DM. Former tobacco abuse.  Quit smoking October 2024.  In summary, patient with history of CAD s/p CABG x 2 in December 2015.  Patient complained of a 2-year history of exertional shortness of breath and chest discomfort radiating to the neck.  Echo showed moderately depressed LV function. Coronary CTA showed moderate disease in the RCA and severe disease in the LAD. Repeat echo January 2016 showed normal LV function with mild septal and lateral hypokinesis, Grade I DD, mild MR. Patient was lost to follow-up until August 2022.  He reported worsening dyspnea with intermittent episodes of sharp exertional chest pain.  Patient had not been taking cardiac medications.  Echo September 2022 showed EF 45 to 50% as detailed above.  Nuclear stress test showed fixed perfusion defect as detailed above.  Patient was last seen at [facility] by Mercy Stall, PA-C on 02/04/2021 for routine follow up. He was doing well at that time and no changes were made.   Patient presented to the ED on 03/05/2023 for chest tightness and exertional shortness of breath for several months. He has not followed up with PCP or cardiology secondary to lack of insurance. He also reported not taking any medications for a couple of years. Initial labs: WBC 6.6, HGB 15.8,  sodium 138, potassium 4.1, creatinine 1.16, BUN 15, BNP 58.2. Troponin was negative. EKG without acute changes. Patient was discharged on Zetia , Toprol , albuterol , and hydrochlorothiazide . He was referred  to cardiology.    Patient was seen in the office 03/09/2023 for hospital follow-up.  He reported continued exertional chest tightness/pressure progressively worsening over the last year with associated dyspnea.  He had been lost to follow-up and off medications since August 2023.  Patient had quit smoking 4 months prior.  He was restarted on atorvastatin .  He underwent nuclear stress testing which showed a fixed defect and no evidence of ischemia.  Echo demonstrated normal LV/RV function.  AAA ultrasound showed infrarenal fusiform dilatation at 2.7 cm (decreased from prior study September 2022).  Carotid duplex demonstrated right ICA 40 to 59%, left ICA 1 to 39%.  Upon follow-up from testing he reported lower extremity claudication.  Lower extremity arterial ultrasound showed bilateral arterial blockages and patient was referred to Dr. Alvenia Aus.  Patient was last seen in the office by Dr. Alvenia Aus on 06/15/2023 for PAD.  He underwent lower extremity angiography and abdominal aortogram on 06/20/2023 and stent was placed to distal left common iliac artery extending into the proximal portion of the external iliac artery.     History of Present Illness    Today, patient is accompanied by his wife. He is doing well since stent placement. His leg discomfort is greatly improved. He still cannot walk the same distances he used to but is tolerating more walking post procedure. His wife states he can get through half of [employer] now. He is otherwise doing well with rare episodes of chest pain. 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     ROS: All other systems reviewed and are otherwise negative except as noted in History of Present Illness.  EKGs/Labs Reviewed       EKG is not ordered today.   03/29/2023: ALT 30; AST 17 06/15/2023: BUN 15; Creatinine, Ser 1.00; Potassium 4.5; Sodium 139   06/15/2023: Hemoglobin 15.6; WBC 8.1   03/29/2023: TSH 1.79   03/05/2023: B Natriuretic Peptide 58.2    Physical Exam     VS:  BP 101/69   Pulse (!) 56   Ht 5' 4 (1.626 m)   Wt 169 lb 12.8 oz (77 kg)   SpO2 96%   BMI 29.15 kg/m  , BMI Body mass index is 29.15 kg/m.  GEN: Well nourished, well developed, in no acute distress. Neck: No JVD. Bilateral carotid bruits. Cardiac:  RRR.  No murmur. No rubs or gallops.   Respiratory:  Respirations regular and unlabored. Clear to auscultation without rales, wheezing or rhonchi. GI: Soft, nontender, nondistended. Extremities: Radials/DP/PT 2+ and equal bilaterally. No clubbing or cyanosis. No edema.  Skin: Warm and dry, no rash. Neuro: Strength intact.  Assessment & Plan   CAD S/p CABG x 31 December 2013.  Nuclear stress test March 2025 was an intermediate risk study with no evidence of ischemia and consistent with prior infarction.  Patient has not been on cardiac medications since August 2023. He was restarted on medications in the ED February 2025.  Patient reports rare episodes of chest pain. He has not needed prn NTG. He is somewhat sedentary. He does some work in shop and states once he gets going on a project he does fine. -Continue Toprol , aspirin , Plavix , atorvastatin , Zetia , prn SL NTG.   Chronic systolic heart failure/ischemic cardiomyopathy Echo March 2025 showed EF 55 to 60%,  no RWMA, LVH, Grade I DD, normal RV size/function, normal PA pressure, borderline dilatation of aortic root 38 mm.  Patient denies lower extremity edema or PND.  Euvolemic and well compensated on exam. -Continue Toprol . GDMT limited secondary to soft BP.    AAA AAA duplex March 2025 showed dilated infrarenal fusiform dilatation 2.7 cm (smaller than September 2022 when measured at 3.1 cm). Patient denies abdominal pain or back pain. -Repeat AAA US  in March 2026.   Carotid artery stenosis Carotid duplex March 2025 showed right ICA 40 to 59%, left ICA 1 to 39%. Patient denies dizziness, presyncope or syncope. -Repeat carotid duplex in March 2026.   Hypertension BP today  101/69.  Home BP in this range. -Continue Toprol , hydrochlorothiazide .   Hyperlipidemia  LDL 91 February 2025, not at goal. -Continue atorvastatin  and Zetia .   PAD S/p stent placement to distal left common iliac artery extending into the proximal portion of the external iliac artery May 2025.  Patient reports improved leg pain. He is able to walk longer distances since stent placement.  - Continue aspirin , Plavix , atorvastatin , Zetia .  Disposition: Return in 6 months or sooner as needed.          Signed, Lonell Rives. Jaylin Benzel, DNP, NP-C

## 2023-07-13 ENCOUNTER — Ambulatory Visit: Attending: Student | Admitting: Student

## 2023-07-13 ENCOUNTER — Encounter: Payer: Self-pay | Admitting: Student

## 2023-07-13 VITALS — BP 101/69 | HR 56 | Ht 64.0 in | Wt 169.8 lb

## 2023-07-13 DIAGNOSIS — I714 Abdominal aortic aneurysm, without rupture, unspecified: Secondary | ICD-10-CM

## 2023-07-13 DIAGNOSIS — I6523 Occlusion and stenosis of bilateral carotid arteries: Secondary | ICD-10-CM

## 2023-07-13 DIAGNOSIS — I25810 Atherosclerosis of coronary artery bypass graft(s) without angina pectoris: Secondary | ICD-10-CM | POA: Diagnosis not present

## 2023-07-13 DIAGNOSIS — I5032 Chronic diastolic (congestive) heart failure: Secondary | ICD-10-CM

## 2023-07-13 DIAGNOSIS — E785 Hyperlipidemia, unspecified: Secondary | ICD-10-CM

## 2023-07-13 DIAGNOSIS — I739 Peripheral vascular disease, unspecified: Secondary | ICD-10-CM

## 2023-07-13 DIAGNOSIS — I10 Essential (primary) hypertension: Secondary | ICD-10-CM

## 2023-07-13 NOTE — Patient Instructions (Signed)
 Medication Instructions:  Your physician recommends that you continue on your current medications as directed. Please refer to the Current Medication list given to you today.   *If you need a refill on your cardiac medications before your next appointment, please call your pharmacy*  Lab Work: None ordered at this time  If you have labs (blood work) drawn today and your tests are completely normal, you will receive your results only by: MyChart Message (if you have MyChart) OR A paper copy in the mail If you have any lab test that is abnormal or we need to change your treatment, we will call you to review the results.  Testing/Procedures: None ordered at this time   Follow-Up: At [facility], you and your health needs are our priority.  As part of our continuing mission to provide you with exceptional heart care, our providers are all part of one team.  This team includes your primary Cardiologist (physician) and Advanced Practice Providers or APPs (Physician Assistants and Nurse Practitioners) who all work together to provide you with the care you need, when you need it.  Your next appointment:   6 month(s)  Provider:   You may see Antionette Kirks, MD or one of the following Advanced Practice Providers on your designated Care Team:   Laneta Pintos, NP Gildardo Labrador, PA-C Varney Gentleman, PA-C Cadence Avalon, PA-C Ronald Cockayne, NP Morey Ar, NP    We recommend signing up for the patient portal called "MyChart".  Sign up information is provided on this After Visit Summary.  MyChart is used to connect with patients for Virtual Visits (Telemedicine).  Patients are able to view lab/test results, encounter notes, upcoming appointments, etc.  Non-urgent messages can be sent to your provider as well.   To learn more about what you can do with MyChart, go to [email or web address].

## 2023-07-28 ENCOUNTER — Other Ambulatory Visit: Payer: Self-pay | Admitting: Nurse Practitioner

## 2023-07-28 DIAGNOSIS — F419 Anxiety disorder, unspecified: Secondary | ICD-10-CM

## 2023-07-28 DIAGNOSIS — G479 Sleep disorder, unspecified: Secondary | ICD-10-CM

## 2023-08-08 ENCOUNTER — Ambulatory Visit: Admitting: Nurse Practitioner

## 2023-08-08 VITALS — BP 110/58 | HR 63 | Temp 97.6°F | Ht 64.0 in | Wt 165.6 lb

## 2023-08-08 DIAGNOSIS — F419 Anxiety disorder, unspecified: Secondary | ICD-10-CM | POA: Diagnosis not present

## 2023-08-08 DIAGNOSIS — G479 Sleep disorder, unspecified: Secondary | ICD-10-CM

## 2023-08-08 DIAGNOSIS — R519 Headache, unspecified: Secondary | ICD-10-CM | POA: Insufficient documentation

## 2023-08-08 MED ORDER — TRAZODONE HCL 50 MG PO TABS: 25.0000 mg | ORAL_TABLET | Freq: Every evening | ORAL | 1 refills | Status: AC | PRN

## 2023-08-08 NOTE — Progress Notes (Signed)
 Established Patient Office Visit  Subjective   [name], male    DOB: 03-25-1960  Age: 63 y.o. MRN: [id number]  Chief Complaint  Patient presents with   Medication Management    Pt stopped trazodone  due to having diarrhea. Pt complains of dizziness and nausea.    Anxiety    Pt complains of not doing well. Started taking 2 tablets of prozac  but went back to 1 tablet on Friday. Pt does not always remember to take medication.     HPI  GAD: Patient was last seen by me on 06/27/2023.  He has a history of anxiety was tried on sertraline  in the past and had adverse drug event.  Patient self discontinued this medication patient was placed on fluoxetine  10 mg daily for 2 weeks and then titrated up to fluoxetine  20 mg daily thereafter.  He is here for follow-up. States that he was started on the prozac  10mg  and then he went to the 20mg  and started having some diahhrea and nausea. They did notice that the pill did change.  States that once the pill changes are still doing 2 capsules this is likely increasing his dose to 40 mg.  They recently changed back to 1 pill a day which is 20 mg of fluoxetine .  Patient has been on trazodone  for several months but did not have any of this until the dose change of fluoxetine .  He feels that his mood is not changed at all. States that he is sleeping a lot. He is not having any trouble going to sleep. States that he will go to bed around 1-2 in the morning and will lay in bed 9-11.     08/08/2023   11:39 AM 04/27/2023    2:29 PM 03/29/2023    2:12 PM  PHQ9 SCORE ONLY  PHQ-9 Total Score 11 6 9        08/08/2023   11:40 AM 04/27/2023    2:29 PM 04/27/2023    2:15 PM 03/29/2023    2:10 PM  GAD 7 : Generalized Anxiety Score  Nervous, Anxious, on Edge 1 1 1 3   Control/stop worrying 1 1 1 3   Worry too much - different things 1 2 2 3   Trouble relaxing 0 0 0 3  Restless 0 0 0 0  Easily annoyed or irritable 1 1 1 1   Afraid - awful might happen 2 1 1  1   Total GAD 7 Score 6 6 6 14   Anxiety Difficulty Somewhat difficult Somewhat difficult Somewhat difficult Not difficult at all        Review of Systems  Constitutional:  Negative for chills and fever.  Respiratory:  Negative for shortness of breath.   Cardiovascular:  Negative for chest pain.  Gastrointestinal:  Positive for diarrhea.  Neurological:  Positive for headaches.  Psychiatric/Behavioral:  Negative for hallucinations and suicidal ideas.       Objective:     BP (!) 110/58   Pulse 63   Temp 97.6 F (36.4 C) (Oral)   Ht 5' 4 (1.626 m)   Wt 165 lb 9.6 oz (75.1 kg)   SpO2 92%   BMI 28.43 kg/m  BP Readings from Last 3 Encounters:  08/08/23 (!) 110/58  07/13/23 101/69  06/27/23 110/70   Wt Readings from Last 3 Encounters:  08/08/23 165 lb 9.6 oz (75.1 kg)  07/13/23 169 lb 12.8 oz (77 kg)  06/27/23 175 lb 9.6 oz (79.7 kg)   SpO2 Readings from  Last 3 Encounters:  08/08/23 92%  07/13/23 96%  06/27/23 96%    Physical Exam Vitals and nursing note reviewed.  Constitutional:      Appearance: Normal appearance.  Cardiovascular:     Rate and Rhythm: Normal rate and regular rhythm.     Heart sounds: Normal heart sounds.  Pulmonary:     Effort: Pulmonary effort is normal.     Breath sounds: Normal breath sounds.  Abdominal:     General: Bowel sounds are normal. There is no distension.     Tenderness: There is no abdominal tenderness.  Neurological:     Mental Status: He is alert.      No results found for any visits on 08/08/23.    The ASCVD Risk score (Arnett DK, et al., 2019) failed to calculate for the following reasons:   Risk score cannot be calculated because patient has a medical history suggesting prior/existing ASCVD    Assessment & Plan:   Problem List Items Addressed This Visit       Other   Anxiety   Continue fluoxetine  20 mg daily.  Follow-up 4 weeks.  Amatory referral to psychiatry for further evaluation and treatment.  Patient  denies HI/SI/AVH.      Relevant Medications   traZODone  (DESYREL ) 50 MG tablet   Other Relevant Orders   Ambulatory referral to Psychiatry   Sleep disturbance - Primary   Continue 25 to 50 mg of trazodone  nightly as needed.      Relevant Medications   traZODone  (DESYREL ) 50 MG tablet   Frequent headaches   Located behind bilateral eyes.  No aching pain scale of 3 out of 10.  Patient is able to take Tylenol  and get it to abate.  He is having to squint and use reading glasses when he is working on his phone.  According to patient spouse is also on quite often and frequently.  Encourage patient to take a break 30 to 60 minutes prior to bed with screen time along with getting his eyes checked      Relevant Medications   traZODone  (DESYREL ) 50 MG tablet    Return in about 4 weeks (around 09/05/2023) for MDD/GAD.    Adina Crandall, NP

## 2023-08-08 NOTE — Patient Instructions (Signed)
 Nice to see you today I want you on fluoxetine  20mg  total daily You can do 0.5 tablets of the trazodone  or a whole tablet if the half is not working  Take a break from the phone 30-60 mins before bed I recommend that you get your eyes checked when you can  Follow up with me in 1 month, sooner if you need me

## 2023-08-08 NOTE — Assessment & Plan Note (Signed)
 Continue fluoxetine  20 mg daily.  Follow-up 4 weeks.  Amatory referral to psychiatry for further evaluation and treatment.  Patient denies HI/SI/AVH.

## 2023-08-08 NOTE — Assessment & Plan Note (Signed)
 Continue 25 to 50 mg of trazodone  nightly as needed.

## 2023-08-08 NOTE — Assessment & Plan Note (Signed)
 Located behind bilateral eyes.  No aching pain scale of 3 out of 10.  Patient is able to take Tylenol  and get it to abate.  He is having to squint and use reading glasses when he is working on his phone.  According to patient spouse is also on quite often and frequently.  Encourage patient to take a break 30 to 60 minutes prior to bed with screen time along with getting his eyes checked

## 2023-08-09 ENCOUNTER — Ambulatory Visit: Admitting: Student

## 2023-09-06 ENCOUNTER — Ambulatory Visit: Admitting: Nurse Practitioner

## 2023-10-10 ENCOUNTER — Ambulatory Visit: Admitting: Nurse Practitioner

## 2023-10-10 VITALS — BP 90/60 | HR 60 | Temp 97.8°F | Ht 64.0 in | Wt 170.8 lb

## 2023-10-10 DIAGNOSIS — F411 Generalized anxiety disorder: Secondary | ICD-10-CM

## 2023-10-10 DIAGNOSIS — E119 Type 2 diabetes mellitus without complications: Secondary | ICD-10-CM | POA: Diagnosis not present

## 2023-10-10 DIAGNOSIS — D692 Other nonthrombocytopenic purpura: Secondary | ICD-10-CM | POA: Diagnosis not present

## 2023-10-10 LAB — POCT GLYCOSYLATED HEMOGLOBIN (HGB A1C): Hemoglobin A1C: 6 % — AB (ref 4.0–5.6)

## 2023-10-10 NOTE — Patient Instructions (Signed)
 Nice to see you today  Your A1C is 6.0, I want to see you in 6 months, sooner If you need me

## 2023-10-10 NOTE — Progress Notes (Signed)
 Established Patient Office Visit  Subjective   [name], male    DOB: Feb 16, 1960  Age: 63 y.o. MRN: [id number]  Chief Complaint  Patient presents with   Follow-up    MDD/GAD. Pt complains that the medicine is working. States of no concerns.      HPI  Discussed the use of AI scribe software for clinical note transcription with the patient, who gave verbal consent to proceed.  History of Present Illness [name] is a 63 year old male with mood disorder and hypertension who presents for follow-up on medication management.  He has been managing his mood disorder with fluoxetine , initially starting at 20 mg and previously increasing to 40 mg, which caused diarrhea. He has since reduced the dose back to 20 mg, which he tolerates well without gastrointestinal side effects. His mood fluctuates but shows some improvement since starting the medication. He continues to take trazodone  for sleep, which he finds effective, allowing him to sleep from midnight to around 8 or 9 AM, feeling rested upon waking.  He has a history of hypertension and is currently on metoprolol  and hydrochlorothiazide . He sometimes forgets to take his medication, which can be as late as 5 PM. He monitors his blood pressure and blood sugar irregularly, with his A1c remaining stable at 6.0 over the past three months. He has improved his water  intake, which he finds more satisfying than other beverages.  He reports experiencing bruising and bleeding easily, even from minor injuries. Even minor injuries can lead to significant bruising and bleeding.  He has reduced his snacking habits and maintains a weight between 165 and 170 pounds. He acknowledges a sedentary lifestyle with limited physical activity, primarily mowing the lawn once a week.  No fever, chills, chest pain, shortness of breath, or thoughts of self-harm or harm to others.     10/10/2023    3:19 PM 08/08/2023   11:39 AM 04/27/2023    2:29 PM   PHQ9 SCORE ONLY  PHQ-9 Total Score 3 11  6       Data saved with a previous flowsheet row definition       10/10/2023    3:14 PM 08/08/2023   11:40 AM 04/27/2023    2:29 PM 04/27/2023    2:15 PM  GAD 7 : Generalized Anxiety Score  Nervous, Anxious, on Edge 0 1 1 1   Control/stop worrying 3 1 1 1   Worry too much - different things 3 1 2 2   Trouble relaxing 0 0 0 0  Restless 0 0 0 0  Easily annoyed or irritable 0 1 1 1   Afraid - awful might happen 0 2 1 1   Total GAD 7 Score 6 6 6 6   Anxiety Difficulty Not difficult at all Somewhat difficult Somewhat difficult Somewhat difficult       Review of Systems  Constitutional:  Negative for chills and fever.  Respiratory:  Negative for shortness of breath.   Cardiovascular:  Negative for chest pain.  Neurological:  Negative for dizziness and headaches.  Psychiatric/Behavioral:  Negative for hallucinations and suicidal ideas.       Objective:     BP 90/60   Pulse 60   Temp 97.8 F (36.6 C) (Oral)   Ht 5' 4 (1.626 m)   Wt 170 lb 12.8 oz (77.5 kg)   SpO2 94%   BMI 29.32 kg/m  BP Readings from Last 3 Encounters:  10/10/23 90/60  08/08/23 (!) 110/58  07/13/23 101/69  Wt Readings from Last 3 Encounters:  10/10/23 170 lb 12.8 oz (77.5 kg)  08/08/23 165 lb 9.6 oz (75.1 kg)  07/13/23 169 lb 12.8 oz (77 kg)   SpO2 Readings from Last 3 Encounters:  10/10/23 94%  08/08/23 92%  07/13/23 96%      Physical Exam Vitals and nursing note reviewed.  Constitutional:      Appearance: Normal appearance.  Cardiovascular:     Rate and Rhythm: Normal rate and regular rhythm.     Heart sounds: Normal heart sounds.  Pulmonary:     Effort: Pulmonary effort is normal.     Breath sounds: Normal breath sounds.  Abdominal:     General: Bowel sounds are normal.  Neurological:     Mental Status: He is alert.      Results for orders placed or performed in visit on 10/10/23  POCT glycosylated hemoglobin (Hb A1C)  Result Value Ref  Range   Hemoglobin A1C 6.0 (A) 4.0 - 5.6 %   HbA1c POC (<> result, manual entry)     HbA1c, POC (prediabetic range)     HbA1c, POC (controlled diabetic range)        The ASCVD Risk score (Arnett DK, et al., 2019) failed to calculate for the following reasons:   Risk score cannot be calculated because patient has a medical history suggesting prior/existing ASCVD    Assessment & Plan:   Problem List Items Addressed This Visit       Cardiovascular and Mediastinum   Senile purpura (HCC)     Endocrine   Diet-controlled diabetes mellitus (HCC) - Primary   Relevant Orders   POCT glycosylated hemoglobin (Hb A1C) (Completed)     Other   GAD (generalized anxiety disorder)  Assessment and Plan Assessment & Plan Depression and generalized anxiety disorder Mood improved with fluoxetine  20 mg daily. Previous increase to 40 mg caused diarrhea. Trazodone  effective for sleep. - Continue fluoxetine  20 mg daily. - Continue trazodone  50 mg nightly as needed for sleep.  Hypertension Blood pressure low possibly due to missed medication. On hydrochlorothiazide  and metoprolol . Advised to hold hydrochlorothiazide  if blood pressure is low. - Hold hydrochlorothiazide  if blood pressure is low and resume the next day. - Continue metoprolol  as prescribed.  Type 2 diabetes mellitus without complications A1c at 6.0, indicating good glycemic control. Improved water  intake and reduced snacking. - Continue current dietary habits and water  intake. - Recheck A1c in 6 months.  Senile purpura Bruising due to clopidogrel  and age-related skin changes. Awaiting cardiologist's decision on aspirin  or clopidogrel . - Await cardiologist's decision on whether to continue aspirin  with clopidogrel .   Return in about 6 months (around 04/08/2024) for CPE and Labs.    Adina Crandall, NP

## 2023-11-12 ENCOUNTER — Other Ambulatory Visit: Payer: Self-pay | Admitting: Nurse Practitioner

## 2024-02-13 NOTE — Progress Notes (Signed)
 "  Cardiology Clinic Note   Date: 02/15/2024 ID: [name], DOB 18-Sep-1960, MRN [id number]  Primary Cardiologist:  Deatrice Cage, MD  Chief Complaint   [name] is a 64 y.o. male who presents to the clinic today for routine follow up.   Patient Profile   [name] is followed by Dr. Cage for the history outlined below.      Past medical history significant for: CAD. LHC 01/02/2014 (abnormal stress test): Ostial LAD 100% with collaterals.  Proximal RCA 60%.  Recommend CT surgery consult. CABG x 2 01/07/2014: LIMA to LAD, SVG to RCA. Nuclear stress test 04/02/2023: Intermediate risk study. No evidence of ischemia. There is a medium defect with severe reduction in uptake present in the apical inferior, inferolateral and apex locations that is fixed. There is abnormal wall motion in the defect area consistent with infarction.  Chronic systolic heart failure/ischemic cardiomyopathy.  Echo 04/25/2023: EF 55 to 60%. No RWMA. Severe asymmetric LVH basal-septal segment. Grade I DD. Normal RV size/function. Normal PA pressure, RVSP 29.4. Aortic valve sclerosis without stenosis. Borderline dilatation of aortic root 38 mm.  PAD. Lower extremity arterial ultrasound/ABI 06/07/2023: Right distal SFA 30 to 49% stenosis, right distal common iliac artery 50 to 74% stenosis.  Left distal common iliac artery 50 to 74% stenosis.  Left mid external iliac artery 75 to 99% stenosis. Lower extremity angiography/abdominal aortogram 06/20/2023: Small infrarenal abdominal aortic aneurysm.  Moderate right common iliac artery disease.  Significant stenosis in the distal left common iliac artery of within an aneurysmal segment with severe stenosis in the proximal external iliac artery.  No significant infrainguinal disease on the left.  Successful balloon expandable stent placement to distal left common iliac artery extending into the proximal portion of the external iliac artery.  Recommendation for DAPT x 6  months with consideration of long-term monotherapy with Plavix  given his vascular disease. AAA. AAA duplex 04/04/2023: Dilated infrarenal fusiform dilatation, 2.7 cm (decreased from previous 3.30 September 2020). Right common iliac >50%, left external iliac >50%.  Carotid artery stenosis. Carotid duplex 04/04/2023: Right ICA 40-59%, ECA <50%. Left ICA 1-39%, ECA >50%. Hypertension.  Hyperlipidemia.  Lipid panel 03/29/2023: LDL 91, HDL 49, TG 57, total 151. T2DM. Former tobacco abuse.  Quit smoking October 2024.  In summary, patient with history of CAD s/p CABG x 2 in December 2015.  Patient complained of a 2-year history of exertional shortness of breath and chest discomfort radiating to the neck.  Echo showed moderately depressed LV function. Coronary CTA showed moderate disease in the RCA and severe disease in the LAD. Repeat echo January 2016 showed normal LV function with mild septal and lateral hypokinesis, Grade I DD, mild MR. Patient was lost to follow-up until August 2022.  He reported worsening dyspnea with intermittent episodes of sharp exertional chest pain.  Patient had not been taking cardiac medications.  Echo September 2022 showed EF 45 to 50% as detailed above.  Nuclear stress test showed fixed perfusion defect as detailed above.  Patient was last seen at [facility] by [name], PA-C on 02/04/2021 for routine follow up. He was doing well at that time and no changes were made.   Patient presented to the ED on 03/05/2023 for chest tightness and exertional shortness of breath for several months. He has not followed up with PCP or cardiology secondary to lack of insurance. He also reported not taking any medications for a couple of years. Initial labs: WBC 6.6, HGB 15.8,  sodium 138, potassium 4.1, creatinine 1.16, BUN 15, BNP 58.2. Troponin was negative. EKG without acute changes. Patient was discharged on Zetia , Toprol , albuterol , and hydrochlorothiazide . He was referred to  cardiology.    Patient was seen in the office 03/09/2023 for hospital follow-up.  He reported continued exertional chest tightness/pressure progressively worsening over the last year with associated dyspnea.  He had been lost to follow-up and off medications since August 2023.  Patient had quit smoking 4 months prior.  He was restarted on atorvastatin .  He underwent nuclear stress testing which showed a fixed defect and no evidence of ischemia.  Echo demonstrated normal LV/RV function.  AAA ultrasound showed infrarenal fusiform dilatation at 2.7 cm (decreased from prior study September 2022).  Carotid duplex demonstrated right ICA 40 to 59%, left ICA 1 to 39%.  Upon follow-up from testing he reported lower extremity claudication.  Lower extremity arterial ultrasound showed bilateral arterial blockages and patient was referred to Dr. [name].  Patient was seen in the office by Dr. [name] on 06/15/2023 for PAD.  He underwent lower extremity angiography and abdominal aortogram on 06/20/2023 and stent was placed to distal left common iliac artery extending into the proximal portion of the external iliac artery.   Patient was last seen in the clinic by me on 07/13/2023 for follow-up after lower extremity intervention.  He reported his lower extremity discomfort was greatly improved and was tolerating walking longer distances.  He was having rare episodes of chest pain but had not needed as needed NTG.     History of Present Illness    Today, patient is accompanied by his wife. He is stable from a cardiac standpoint. He continues to have DOE requiring frequent rest breaks with activities. His wife reports they were loading some wood in to the truck and for every piece he moved she was moving two. This is not new for him and he feels it is stable. He has not been seen by pulmonology. He continues to not smoke.  He denies chest pain, pressure or tightness. No lower extremity edema or palpitations. His BP continues to be  low.     ROS: All other systems reviewed and are otherwise negative except as noted in History of Present Illness.  EKGs/Labs Reviewed    EKG Interpretation Date/Time:  Friday February 15 2024 09:15:26 EST Ventricular Rate:  66 PR Interval:  166 QRS Duration:  84 QT Interval:  422 QTC Calculation: 442 R Axis:   -18  Text Interpretation: Normal sinus rhythm T wave abnormality, consider anterior ischemia When compared with ECG of 09-Mar-2023 15:30, QT has lengthened Confirmed by Loistine Sober [phone]) on 02/15/2024 9:20:53 AM   03/29/2023: ALT 30; AST 17 06/15/2023: BUN 15; Creatinine, Ser 1.00; Potassium 4.5; Sodium 139   06/15/2023: Hemoglobin 15.6; WBC 8.1   03/29/2023: TSH 1.79   03/05/2023: B Natriuretic Peptide 58.2    Physical Exam    VS:  BP 90/62 (BP Location: Left Arm, Patient Position: Sitting, Cuff Size: Normal)   Pulse 66   Ht 5' 4 (1.626 m)   Wt 173 lb (78.5 kg)   SpO2 96%   BMI 29.70 kg/m  , BMI Body mass index is 29.7 kg/m.  GEN: Well nourished, well developed, in no acute distress. Neck: No JVD. Bilateral carotid bruits. Cardiac:  RRR.  No murmur. No rubs or gallops.   Respiratory:  Respirations regular and unlabored. Clear to auscultation without rales, wheezing or rhonchi. GI: Soft, nontender, nondistended. Extremities: Radials/DP/PT 2+  and equal bilaterally. No clubbing or cyanosis. No edema   Skin: Warm and dry, no rash. Neuro: Strength intact.  Assessment & Plan   CAD S/p CABG x 31 December 2013.  Nuclear stress test March 2025 was an intermediate risk study with no evidence of ischemia and consistent with prior infarction.  Patient had not been on cardiac medications since August 2023 secondary to financial/insurance concerns. He was restarted on medications in the ED February 2025.  Patient denies chest pain, pressure or tightness. He does not participate in routine exercise. Discussed starting a walking program. Patient's wife states she is  considering getting a treadmill. They also live across the stress from a 1/4 mile walking track. EKG without acute changes.  - Increase physical activity as tolerated.  - Continue Toprol , aspirin , Plavix , atorvastatin , Zetia , prn SL NTG.   Chronic systolic heart failure with improved LV function/ischemic cardiomyopathy/DOE Echo March 2025 showed EF 55 to 60%, no RWMA, LVH, Grade I DD, normal RV size/function, normal PA pressure, borderline dilatation of aortic root 38 mm.  Patient reports chronic DOE unchanged from previous. He has never seen pulmonology or had a chest CT for cancer screening.   He denies lower extremity edema, orthopnea or PND. Euvolemic and well compensated on exam. - Refer to pulmonology.  - Continue Toprol . GDMT limited secondary to soft BP.    AAA AAA duplex March 2025 showed dilated infrarenal fusiform dilatation 2.7 cm (smaller than September 2022 when measured at 3.1 cm). Patient denies abdominal pain or back pain. - Repeat AAA US  in March.    Carotid artery stenosis Carotid duplex March 2025 showed right ICA 40 to 59%, left ICA 1 to 39%. Patient denies presyncope or syncope. He will have mild lightheadedness with quick position changes. Bilateral carotid bruits.  -Repeat carotid duplex in March.    Hypertension BP today 90/62.  He has occasional lightheadedness with position changes. - Stop hydrochlorothiazide . If BP is persistently low can decrease Toprol  to 12.5 mg. Contact office through MyChart.  - Continue Toprol .   Hyperlipidemia  LDL 91 February 2025, not at goal. -Continue atorvastatin  and Zetia . - Lipid panel and CMP today.   PAD S/p stent placement to distal left common iliac artery extending into the proximal portion of the external iliac artery May 2025.  Patient denies claudication.  - Continue aspirin , Plavix , atorvastatin , Zetia .  Disposition:  Lipid panel, CBC, CMP today. Carotid US  and AAA US  in March. Return in 6 months or sooner as needed.           Signed, Barnie HERO. Tharon Kitch, DNP, NP-C  "

## 2024-02-15 ENCOUNTER — Encounter: Payer: Self-pay | Admitting: Student

## 2024-02-15 ENCOUNTER — Ambulatory Visit: Attending: Student | Admitting: Student

## 2024-02-15 VITALS — BP 90/62 | HR 66 | Ht 64.0 in | Wt 173.0 lb

## 2024-02-15 DIAGNOSIS — I739 Peripheral vascular disease, unspecified: Secondary | ICD-10-CM

## 2024-02-15 DIAGNOSIS — E785 Hyperlipidemia, unspecified: Secondary | ICD-10-CM | POA: Diagnosis not present

## 2024-02-15 DIAGNOSIS — I5020 Unspecified systolic (congestive) heart failure: Secondary | ICD-10-CM | POA: Diagnosis not present

## 2024-02-15 DIAGNOSIS — I714 Abdominal aortic aneurysm, without rupture, unspecified: Secondary | ICD-10-CM

## 2024-02-15 DIAGNOSIS — I6523 Occlusion and stenosis of bilateral carotid arteries: Secondary | ICD-10-CM | POA: Diagnosis not present

## 2024-02-15 DIAGNOSIS — I25810 Atherosclerosis of coronary artery bypass graft(s) without angina pectoris: Secondary | ICD-10-CM | POA: Diagnosis not present

## 2024-02-15 DIAGNOSIS — Z79899 Other long term (current) drug therapy: Secondary | ICD-10-CM

## 2024-02-15 DIAGNOSIS — R0609 Other forms of dyspnea: Secondary | ICD-10-CM | POA: Diagnosis not present

## 2024-02-15 NOTE — Patient Instructions (Signed)
 Medication Instructions:   Your physician recommends that you continue on your current medications as directed. Please refer to the Current Medication list given to you today.    *If you need a refill on your cardiac medications before your next appointment, please call your pharmacy*  Lab Work:  Your provider would like for you to have following labs drawn today CBC, CMet, Lipid Panel.    If you have labs (blood work) drawn today and your tests are completely normal, you will receive your results only by:  MyChart Message (if you have MyChart) OR  A paper copy in the mail If you have any lab test that is abnormal or we need to change your treatment, we will call you to review the results.  Testing/Procedures:  Your physician has requested that you have a carotid duplex. This test is an ultrasound of the carotid arteries in your neck. It looks at blood flow through these arteries that supply the brain with blood.   Allow one hour for this exam.  There are no restrictions or special instructions.  This will take place at [address] [facility]) [address], [place] [address]  Please note: We ask at that you not bring children with you during ultrasound (echo/ vascular) testing. Due to room size and safety concerns, children are not allowed in the ultrasound rooms during exams. Our front office staff cannot provide observation of children in our lobby area while testing is being conducted. An adult accompanying a patient to their appointment will only be allowed in the ultrasound room at the discretion of the ultrasound technician under special circumstances. We apologize for any inconvenience.   Your physician has requested that you have an abdominal aorta duplex. During this test, an ultrasound is used to evaluate the aorta. Allow 30 minutes for this exam. Do not eat after midnight the day before and avoid carbonated beverages. This will take place at [address] [facility]) [address], [place] [address]    Referrals:  Your cardiologist has referred you to Pulmonary  We have attached their office location and phone number below.  Please allow them 3-5 business days to reach out to you to make an appointment.  If you have not heard from their office within that time, please call them to schedule your appointment.    Follow-Up:  At [facility], you and your health needs are our priority.  As part of our continuing mission to provide you with exceptional heart care, our providers are all part of one team.  This team includes your primary Cardiologist (physician) and Advanced Practice Providers or APPs (Physician Assistants and Nurse Practitioners) who all work together to provide you with the care you need, when you need it.  Your next appointment:   5 - 6 month(s)  Provider:    You may see Deatrice Cage, MD or one of the following Advanced Practice Providers on your designated Care Team:   Lonni Meager, NP Lesley Maffucci, PA-C Bernardino Bring, PA-C Cadence Eskridge, PA-C Tylene Lunch, NP Barnie Hila, NP    We recommend signing up for the patient portal called MyChart.  Sign up information is provided on this After Visit Summary.  MyChart is used to connect with patients for Virtual Visits (Telemedicine).  Patients are able to view lab/test results, encounter notes, upcoming appointments, etc.  Non-urgent messages can be sent to your provider as well.   To learn more about what you can do with  MyChart, go to [email or web address].

## 2024-02-16 LAB — COMPREHENSIVE METABOLIC PANEL WITH GFR
ALT: 24 IU/L (ref 0–44)
AST: 16 IU/L (ref 0–40)
Albumin: 4.3 g/dL (ref 3.9–4.9)
Alkaline Phosphatase: 97 IU/L (ref 47–123)
BUN/Creatinine Ratio: 19 (ref 10–24)
BUN: 20 mg/dL (ref 8–27)
Bilirubin Total: 0.4 mg/dL (ref 0.0–1.2)
CO2: 21 mmol/L (ref 20–29)
Calcium: 9.4 mg/dL (ref 8.6–10.2)
Chloride: 97 mmol/L (ref 96–106)
Creatinine, Ser: 1.08 mg/dL (ref 0.76–1.27)
Globulin, Total: 2.3 g/dL (ref 1.5–4.5)
Glucose: 108 mg/dL — ABNORMAL HIGH (ref 70–99)
Potassium: 5.1 mmol/L (ref 3.5–5.2)
Sodium: 132 mmol/L — ABNORMAL LOW (ref 134–144)
Total Protein: 6.6 g/dL (ref 6.0–8.5)
eGFR: 76 mL/min/1.73

## 2024-02-16 LAB — CBC
Hematocrit: 44.8 % (ref 37.5–51.0)
Hemoglobin: 14.9 g/dL (ref 13.0–17.7)
MCH: 29.4 pg (ref 26.6–33.0)
MCHC: 33.3 g/dL (ref 31.5–35.7)
MCV: 88 fL (ref 79–97)
Platelets: 206 x10E3/uL (ref 150–450)
RBC: 5.07 x10E6/uL (ref 4.14–5.80)
RDW: 13.4 % (ref 11.6–15.4)
WBC: 7.9 x10E3/uL (ref 3.4–10.8)

## 2024-02-16 LAB — LIPID PANEL
Chol/HDL Ratio: 4.1 ratio (ref 0.0–5.0)
Cholesterol, Total: 184 mg/dL (ref 100–199)
HDL: 45 mg/dL
LDL Chol Calc (NIH): 123 mg/dL — ABNORMAL HIGH (ref 0–99)
Triglycerides: 89 mg/dL (ref 0–149)
VLDL Cholesterol Cal: 16 mg/dL (ref 5–40)

## 2024-02-18 ENCOUNTER — Ambulatory Visit: Payer: Self-pay | Admitting: Student

## 2024-02-18 DIAGNOSIS — Z79899 Other long term (current) drug therapy: Secondary | ICD-10-CM

## 2024-02-19 ENCOUNTER — Encounter: Payer: Self-pay | Admitting: Internal Medicine

## 2024-02-19 ENCOUNTER — Ambulatory Visit: Admitting: Internal Medicine

## 2024-02-19 VITALS — BP 140/80 | HR 67 | Temp 98.1°F | Ht 64.0 in | Wt 177.4 lb

## 2024-02-19 DIAGNOSIS — Z87891 Personal history of nicotine dependence: Secondary | ICD-10-CM

## 2024-02-19 DIAGNOSIS — R0683 Snoring: Secondary | ICD-10-CM

## 2024-02-19 DIAGNOSIS — R0602 Shortness of breath: Secondary | ICD-10-CM

## 2024-02-19 LAB — NITRIC OXIDE: Nitric Oxide: 22

## 2024-02-19 MED ORDER — HYDROCHLOROTHIAZIDE 12.5 MG PO TABS: 12.5000 mg | ORAL_TABLET | Freq: Every day | ORAL | 3 refills | Status: AC

## 2024-02-19 MED ORDER — METOPROLOL SUCCINATE ER 25 MG PO TB24: 12.5000 mg | ORAL_TABLET | Freq: Every day | ORAL | 3 refills | Status: AC

## 2024-02-19 MED ORDER — FLUTICASONE-SALMETEROL 230-21 MCG/ACT IN AERO: 2.0000 | INHALATION_SPRAY | Freq: Two times a day (BID) | RESPIRATORY_TRACT | 5 refills | Status: DC

## 2024-02-19 MED ORDER — ATORVASTATIN CALCIUM 80 MG PO TABS: 80.0000 mg | ORAL_TABLET | Freq: Every day | ORAL | 3 refills | Status: AC

## 2024-02-19 NOTE — Telephone Encounter (Signed)
 Called and spoke with Maceo, patient's wife, per DPR.  Informed her of the recommendation from Barnie Hila, NP to reduce Metoprolol  Succinate (Toprol  XL) to 12.5 mg daily and restart Hydrochlorothiazide  (Hydrodiuril ) 12.5 mg once daily.  Changes made to patient's medication list in chart and prescriptions sent to [facility], patient's preferred pharmacy.  All questions and concerns addressed at this time.  Maceo verbalized understanding and agreement with the treatment plan.  Advised her to call us  back with any further questions or concerns, or any changes in patient's condition.

## 2024-02-19 NOTE — Telephone Encounter (Signed)
 Called and spoke with Maceo, patient's wife, per DPR.  Informed her of the most recent lab results as interpreted by Barnie Grace, NP as well as the recommendation to increase Atorvastatin  to 80 mg daily and repeat Lipid Panel, and Hepatic Function Panel in about 2 months.  Maceo verbalized understanding and agreement with the therapy plan.  Maceo did state that the patient is experiencing some swelling in the ankles and wrists.  When asked if he was taking all of his medications as prescribed including the Hydrochlorothiazide , Maceo states that he is not taking that since blood pressure has been running good with numbers in the 120s/80s.  Advised her that I will pass this information on to Barnie and will call back with suggestion/recommendation.  All other questions and concerns addressed at this time.

## 2024-02-19 NOTE — Telephone Encounter (Signed)
-----   Message from Barnie Hila, NP sent at 02/19/2024  8:41 AM EST ----- Please ask patient to decrease Toprol  to 12.5 mg daily and add back in hydrochlorothiazide  12.5 mg daily and see if the edema resolves and his blood pressure remains stable.   Thank you!  DW ----- Message ----- From: Tobie Mac LABOR, RN Sent: 02/19/2024   8:21 AM EST To: Barnie Hila, NP  ----- Message from Mac LABOR Tobie, RN sent at 02/19/2024  8:21 AM EST -----  Wife states patient is not taking Hydrochlorothiazide  and is experiencing some swelling in ankles and wrists.  ----- Message ----- From: Hila Barnie, NP Sent: 02/18/2024  11:08 AM EST To: Mac LABOR Tobie, RN  Please let patient know kidney function and liver function are normal. Sodium is just a little low. He can have a little more sodium in his diet (no more than 2000 mg). LDL is still elevated at 123.  I would like him to increase atorvastatin  to 80 mg daily. Recheck lipid panel and LFTs in 2 months.   Thank you!  DW

## 2024-02-19 NOTE — Telephone Encounter (Signed)
-----   Message from Barnie Hila, NP sent at 02/18/2024 11:08 AM EST ----- Please let patient know kidney function and liver function are normal. Sodium is just a little low. He can have a little more sodium in his diet (no more than 2000 mg). LDL is still elevated at 123.  I would like him to increase atorvastatin  to 80 mg daily. Recheck lipid panel and LFTs in 2 months.   Thank you!  DW

## 2024-02-19 NOTE — Progress Notes (Signed)
 " [facility] Pulmonary Medicine Consultation      Date: 02/19/2024,   MRN# [id number] [name] 12/11/1960      CHIEF COMPLAINT:   Assessment shortness of breath Assessment of sleep apnea  HISTORY OF PRESENT ILLNESS    Assessment of sleep apnea Patient with extensive snoring Daytime fatigue, daytime sleepiness    02/19/2024   10:00 AM  Results of the Epworth flowsheet  Sitting and reading 3  Watching TV 2  Sitting, inactive in a public place (e.g. a theatre or a meeting) 0  As a passenger in a car for an hour without a break 0  Lying down to rest in the afternoon when circumstances permit 3  Sitting and talking to someone 0  Sitting quietly after a lunch without alcohol 0  In a car, while stopped for a few minutes in traffic 0  Total score 8  Recommend split-night sleep study due to significant coronary artery disease   Patient with extensive smoking history 1 pack a day for the last 4 years quit tobacco 1 year ago Recommend lung cancer screening program  Assessment shortness of breath With extensive smoking history patient likely has underlying COPD Patient does have albuterol  inhaler which does not help No evidence of exacerbation at this time No wheezing no coughing   Assessment of ASTHMA   Lab Results  Component Value Date   NITRICOXIDE 22 02/19/2024   Elevated exhaled Nitric oxide  testing is NOT highly consistent with type II inflammation   No exacerbation at this time No evidence of heart failure at this time No evidence or signs of infection at this time No respiratory distress No fevers, chills, nausea, vomiting, diarrhea No evidence of lower extremity edema No evidence hemoptysis   Extensive chart review cardiac history reviewed with patient  Diagnosis of CAD S/p CABG x 31 December 2013.  Nuclear stress test March 2025 was an intermediate risk study with no evidence of ischemia and consistent with prior infarction.  Patient had not been  on cardiac medications since August 2023 secondary to financial/insurance concerns. He was restarted on medications in the ED February 2025.  Patient denies chest pain, pressure or tightness. He does not participate in routine exercise. Discussed starting a walking program. Patient's wife states she is considering getting a treadmill. They also live across the stress from a 1/4 mile walking track. EKG without acute changes.  - Increase physical activity as tolerated.  - Continue Toprol , aspirin , Plavix , atorvastatin , Zetia , prn SL NTG.   Diagnosis of chronic systolic heart failure Chronic systolic heart failure with improved LV function/ischemic cardiomyopathy/DOE Echo March 2025 showed EF 55 to 60%, no RWMA, LVH, Grade I DD, normal RV size/function, normal PA pressure, borderline dilatation of aortic root 38 mm.  Patient reports chronic DOE unchanged from previous. He has never seen pulmonology or had a chest CT for cancer screening.   He denies lower extremity edema, orthopnea or PND. Euvolemic and well compensated on exam. - Refer to pulmonology.  - Continue Toprol . GDMT limited secondary to soft BP.    AAA AAA duplex March 2025 showed dilated infrarenal fusiform dilatation 2.7 cm (smaller than September 2022 when measured at 3.1 cm). Patient denies abdominal pain or back pain.   Carotid artery stenosis Carotid duplex March 2025 showed right ICA 40 to 59%, left ICA 1 to 39%. Patient denies presyncope or syncope. He will have mild lightheadedness with quick position changes. Bilateral carotid bruits.    Hypertension BP today 90/62.  He has occasional lightheadedness with position changes.  Hyperlipidemia  LDL 91 February 2025, not at goal.   PAD S/p stent placement to distal left common iliac artery extending into the proximal portion of the external iliac artery May 2025.  Patient denies claudication.   PAST MEDICAL HISTORY   Past Medical History:  Diagnosis Date   Anginal pain     Anxiety    Hyperlipidemia    Hypertension    Myocardial infarction [facility])    Neuropathy      SURGICAL HISTORY   Past Surgical History:  Procedure Laterality Date   ABDOMINAL AORTOGRAM N/A 06/20/2023   Procedure: ABDOMINAL AORTOGRAM;  Surgeon: [name] Deatrice LABOR, MD;  Location: [facility] INVASIVE CV LAB;  Service: Cardiovascular;  Laterality: N/A;   COLONOSCOPY     CORONARY ARTERY BYPASS GRAFT N/A 01/07/2014   Procedure: CORONARY ARTERY BYPASS GRAFTING (CABG);  Surgeon: Dallas KATHEE Jude, MD;  Location: [facility] OR;  Service: Open Heart Surgery;  Laterality: N/A;  Coronary Artery Bypass Graft times two with internal mammary artery to Left Anterior Descending Coronary Artery and SVG to Distal Right Coronary Artery   FOOT SURGERY     right   LOWER EXTREMITY ANGIOGRAPHY Bilateral 06/20/2023   Procedure: Lower Extremity Angiography;  Surgeon: [name] Deatrice LABOR, MD;  Location: [facility] INVASIVE CV LAB;  Service: Cardiovascular;  Laterality: Bilateral;  limited study   LOWER EXTREMITY INTERVENTION Left 06/20/2023   Procedure: LOWER EXTREMITY INTERVENTION;  Surgeon: [name] Deatrice LABOR, MD;  Location: [facility] INVASIVE CV LAB;  Service: Cardiovascular;  Laterality: Left;   REFRACTIVE SURGERY Bilateral 2021   TEE WITHOUT CARDIOVERSION N/A 01/07/2014   Procedure: TRANSESOPHAGEAL ECHOCARDIOGRAM (TEE);  Surgeon: Dallas KATHEE Jude, MD;  Location: [facility] OR;  Service: Open Heart Surgery;  Laterality: N/A;     FAMILY HISTORY   Family History  Problem Relation Age of Onset   Diabetes Father    Diabetes Sister    Mental illness Sister      SOCIAL HISTORY   Social History[1]   MEDICATIONS    Home Medication:  Current Outpatient Rx   Order #: [id number] Class: Historical Med   Order #: [id number] Class: Normal   Order #: [id number] Class: OTC   Order #: [id number] Class: Normal   Order #: [id number] Class: Normal   Order #: [id number] Class: Normal   Order #: [id number] Class: Normal   Order #: [id number] Class: Normal   Order #:  [id number] Class: Normal   Order #: [id number] Class: Historical Med   Order #: [id number] Class: Normal   Order #: [id number] Class: Normal    Current Medication: Current Medications[2]    ALLERGIES   Sertraline  hcl  BP (!) 140/80   Pulse 67   Temp 98.1 F (36.7 C)   Ht 5' 4 (1.626 m)   Wt 177 lb 6.4 oz (80.5 kg)   SpO2 95%   BMI 30.45 kg/m    Review of Systems: Gen:  Denies  fever, sweats, chills weight loss  HEENT: Denies blurred vision, double vision, ear pain, eye pain, hearing loss, nose bleeds, sore throat Cardiac:  No dizziness, chest pain or heaviness, chest tightness,edema, No JVD Resp:   No cough, -sputum production, +shortness of breath,-wheezing, -hemoptysis,  Other:  All other systems negative   Physical Examination:   General Appearance: No distress  EYES PERRLA, EOM intact.   NECK Supple, No JVD Pulmonary: normal breath sounds, No wheezing.  CardiovascularNormal S1,S2.  No m/r/g.   Abdomen: Benign, Soft, non-tender. Neurology UE/LE 5/5 strength, no focal deficits Ext pulses intact, cap  refill intact ALL OTHER ROS ARE NEGATIVE     ASSESSMENT/PLAN   64 year old pleasant white male seen today for multiple issues including a history of significant coronary artery disease status post CABG with peripheral artery disease and bilateral carotid stenosis in the setting of extensive smoking history quit 1 year ago with signs and symptoms of shortness of breath and dyspnea on exertion likely related to underlying COPD in the setting of possible underlying diagnosis of sleep apnea with a 30 pound weight gain in the last 1 year   Assessment of shortness of breath Recommend pulmonary function testing Start Advair HFA 2 puffs in the morning 2 puffs at night Rinse mouth after use  Assessment of sleep apnea Recommend in-lab sleep study for definitive diagnosis Patient with a history of coronary artery disease  Extensive smoking history Recommend lung cancer  screening program  Obesity Recommend weight loss  Extensive CAD CABG Follow-up with cardiology  MEDICATION ADJUSTMENTS/LABS AND TESTS ORDERED: Feno Ambulating pulse ox Low-dose CT scan Sleep study Pulmonary function testing Start Advair-rinse mouth after use   CURRENT MEDICATIONS REVIEWED AT LENGTH WITH PATIENT TODAY   Patient  satisfied with Plan of action and management. All questions answered   Follow up 3 months   I spent a total of 62 minutes dedicated to the care of this patient on the date of this encounter to include pre-visit review of records, face-to-face time with the patient discussing conditions above, post visit ordering of testing, clinical documentation with the electronic health record, making appropriate referrals as documented, and communicating necessary information to the patient's healthcare team.    The Patient requires high complexity decision making for assessment and support, frequent evaluation and titration of therapies, application of advanced monitoring technologies and extensive interpretation of multiple databases.  Patient satisfied with Plan of action and management. All questions answered    Nickolas Alm Cellar, M.D.  [facility]  Medical Director [facility]                 [1]  Social History Tobacco Use   Smoking status: Former    Current packs/day: 0.00    Average packs/day: 0.3 packs/day for 50.0 years (12.5 ttl pk-yrs)    Types: Cigarettes    Quit date: 11/19/2022    Years since quitting: 1.2   Smokeless tobacco: Never  Vaping Use   Vaping status: Never Used  Substance Use Topics   Alcohol use: No   Drug use: Yes    Types: Marijuana  [2]  Current Outpatient Medications:    acetaminophen  (TYLENOL ) 500 MG tablet, Take 1,000 mg by mouth every 6 (six) hours as needed for mild pain (pain score 1-3) or moderate pain (pain score 4-6)., Disp: , Rfl:    albuterol  (VENTOLIN  HFA)  108 (90 Base) MCG/ACT inhaler, Inhale 2 puffs into the lungs every 6 (six) hours as needed for wheezing or shortness of breath., Disp: 8 g, Rfl: 2   aspirin  EC 81 MG tablet, Take 1 tablet (81 mg total) by mouth daily. Swallow whole., Disp: , Rfl:    atorvastatin  (LIPITOR) 80 MG tablet, Take 1 tablet (80 mg total) by mouth daily., Disp: 90 tablet, Rfl: 3   clopidogrel  (PLAVIX ) 75 MG tablet, Take 1 tablet (75 mg total) by mouth daily., Disp: 90 tablet, Rfl: 3   ezetimibe  (ZETIA ) 10 MG tablet, Take 1 tablet (10 mg total) by mouth daily., Disp: 90 tablet, Rfl: 3   FLUoxetine  (PROZAC ) 20 MG  capsule, TAKE 1 CAPSULE (20 MG TOTAL) BY MOUTH DAILY., Disp: 90 capsule, Rfl: 1   hydrochlorothiazide  (HYDRODIURIL ) 12.5 MG tablet, Take 1 tablet (12.5 mg total) by mouth daily., Disp: 90 tablet, Rfl: 3   metoprolol  succinate (TOPROL  XL) 25 MG 24 hr tablet, Take 0.5 tablets (12.5 mg total) by mouth daily., Disp: 45 tablet, Rfl: 3   naproxen sodium (ALEVE) 220 MG tablet, Take 220 mg by mouth 2 (two) times daily as needed (Pain). (Patient not taking: Reported on 02/15/2024), Disp: , Rfl:    nitroGLYCERIN  (NITROSTAT ) 0.4 MG SL tablet, Place 1 tablet (0.4 mg total) under the tongue every 5 (five) minutes as needed for chest pain., Disp: 90 tablet, Rfl: 3   traZODone  (DESYREL ) 50 MG tablet, Take 0.5-1 tablets (25-50 mg total) by mouth at bedtime as needed for sleep., Disp: 90 tablet, Rfl: 1  "

## 2024-02-19 NOTE — Patient Instructions (Signed)
 Recommend weight loss Recommend lung cancer screening program assessment Recommend referral to in-lab sleep study due to significant heart disease Recommend pulmonary function testing Recommend starting Advair 2 puffs in the morning 2 puffs at night Please rinse mouth after use  Avoid Allergens and Irritants Avoid secondhand smoke Avoid SICK contacts Recommend  Masking  when appropriate Recommend Keep up-to-date with vaccinations

## 2024-02-21 ENCOUNTER — Telehealth: Payer: Self-pay

## 2024-02-21 NOTE — Telephone Encounter (Signed)
 Copied from CRM #[id number]. Topic: Clinical - Prescription Issue >> Feb 21, 2024 12:18 PM Dedra B wrote: Reason for CRM: Patient's wife, Maceo, said fluticasone -salmeterol (ADVAIR HFA) 230-21 MCG/ACT inhaler is over $400. She would like something affordable called in.

## 2024-02-22 ENCOUNTER — Other Ambulatory Visit (HOSPITAL_COMMUNITY): Payer: Self-pay

## 2024-02-22 ENCOUNTER — Telehealth: Payer: Self-pay

## 2024-02-22 DIAGNOSIS — Z87891 Personal history of nicotine dependence: Secondary | ICD-10-CM

## 2024-02-22 DIAGNOSIS — R0602 Shortness of breath: Secondary | ICD-10-CM

## 2024-02-22 NOTE — Telephone Encounter (Signed)
 Per test claims at this time:   Generic Advair HFA- $425.71 Brand Advair HFA- $394.72 Generic Advair Diskus/Wixela- $87.69 Brand Advair Diksus- $196.92 Generic Symbicort- $211.09 Brand Symbicort- $235.50 Breyna 10.3gm- $213.16 Generic Breo Ellipta- $360.12 [name]- [phone] Brand Dulera- [phone]  *patient seems to have a deductible causing higher prices

## 2024-02-26 MED ORDER — FLUTICASONE-SALMETEROL 500-50 MCG/ACT IN AEPB: 1.0000 | INHALATION_SPRAY | Freq: Two times a day (BID) | RESPIRATORY_TRACT | 3 refills | Status: AC

## 2024-02-26 NOTE — Addendum Note (Signed)
 Addended by: Tiyana Galla J on: 02/26/2024 11:49 AM   Modules accepted: Orders

## 2024-03-05 ENCOUNTER — Telehealth: Payer: Self-pay

## 2024-03-05 DIAGNOSIS — Z87891 Personal history of nicotine dependence: Secondary | ICD-10-CM

## 2024-03-05 DIAGNOSIS — Z122 Encounter for screening for malignant neoplasm of respiratory organs: Secondary | ICD-10-CM

## 2024-03-05 NOTE — Telephone Encounter (Addendum)
 Lung Cancer Screening Narrative/Criteria Questionnaire (Cigarette Smokers Only- No Cigars/Pipes/vapes)   [name]   SDMV:03/12/2024 11:45 am [name]        30-May-1960               LDCT: Will call back to schedule scan once he looks at his schedule. RN will     call back after appt if not scheduled.     64 y.o.   Phone: [phone]  Lung Screening Narrative (confirm age 81-77 yrs Medicare / 50-80 yrs Private pay insurance)   Insurance information: [facility] Medicare [id number]   Referring Provider: Wendee, NP   This screening involves an initial phone call with a team member from our program. It is called a shared decision making visit. The initial meeting is required by  insurance and Medicare to make sure you understand the program. This appointment takes about 15-20 minutes to complete. You will complete the screening scan at your scheduled date/time.  This scan takes about 5-10 minutes to complete. You can eat and drink normally before and after the scan.  Criteria questions for Lung Cancer Screening:   Are you a current or former smoker? Former Age began smoking: 12   If you are a former smoker, what year did you quit smoking? Quit 1.5 years ago (quit within 15 yrs)   To calculate your smoking history, I need an accurate estimate of how many packs of cigarettes you smoked per day and for how many years. (Not just the number of PPD you are now smoking)   Years smoking 49.5 x Packs per day 2 = Pack years 99   (at least 20 pack yrs)   (Make sure they understand that we need to know how much they have smoked in the past, not just the number of PPD they are smoking now)  Do you have a personal history of cancer?  No    Do you have a family history of cancer? No  Are you coughing up blood?  No  Have you had unexplained weight loss of 15 lbs or more in the last 6 months? No  It looks like you meet all criteria.  When would be a good time for us  to schedule you for this  screening?   Additional information: N/A

## 2024-03-12 ENCOUNTER — Encounter: Admitting: Adult Health

## 2024-04-08 ENCOUNTER — Encounter: Admitting: Nurse Practitioner

## 2024-05-15 ENCOUNTER — Ambulatory Visit

## 2024-05-20 ENCOUNTER — Ambulatory Visit: Admitting: Internal Medicine

## 2024-05-20 ENCOUNTER — Encounter

## 2024-07-29 ENCOUNTER — Ambulatory Visit: Admitting: Student
